# Patient Record
Sex: Female | Born: 1955 | Race: White | Hispanic: No | Smoking: Never smoker
Health system: Southern US, Community
[De-identification: ages and names within clinical notes are randomized; demographics above are authoritative.]

## PROBLEM LIST (undated history)

## (undated) DIAGNOSIS — J329 Chronic sinusitis, unspecified: Secondary | ICD-10-CM

## (undated) DIAGNOSIS — I10 Essential (primary) hypertension: Secondary | ICD-10-CM

## (undated) DIAGNOSIS — E669 Obesity, unspecified: Secondary | ICD-10-CM

## (undated) DIAGNOSIS — Z87442 Personal history of urinary calculi: Secondary | ICD-10-CM

## (undated) DIAGNOSIS — H209 Unspecified iridocyclitis: Secondary | ICD-10-CM

## (undated) DIAGNOSIS — K219 Gastro-esophageal reflux disease without esophagitis: Secondary | ICD-10-CM

## (undated) DIAGNOSIS — L570 Actinic keratosis: Secondary | ICD-10-CM

## (undated) HISTORY — PX: TONSILLECTOMY: SUR1361

## (undated) HISTORY — PX: CHOLECYSTECTOMY: SHX55

## (undated) HISTORY — PX: ERCP: SHX60

## (undated) HISTORY — DX: Actinic keratosis: L57.0

---

## 2004-09-19 ENCOUNTER — Ambulatory Visit: Payer: Self-pay | Admitting: Internal Medicine

## 2005-10-30 ENCOUNTER — Ambulatory Visit: Payer: Self-pay | Admitting: Internal Medicine

## 2007-05-27 ENCOUNTER — Ambulatory Visit: Payer: Self-pay | Admitting: Internal Medicine

## 2007-11-13 ENCOUNTER — Ambulatory Visit: Payer: Self-pay | Admitting: Unknown Physician Specialty

## 2008-05-12 ENCOUNTER — Ambulatory Visit: Payer: Self-pay | Admitting: Internal Medicine

## 2008-05-27 ENCOUNTER — Ambulatory Visit: Payer: Self-pay | Admitting: Internal Medicine

## 2009-08-08 ENCOUNTER — Ambulatory Visit: Payer: Self-pay | Admitting: Internal Medicine

## 2010-08-10 ENCOUNTER — Ambulatory Visit: Payer: Self-pay | Admitting: Internal Medicine

## 2010-08-13 ENCOUNTER — Ambulatory Visit: Payer: Self-pay | Admitting: Internal Medicine

## 2011-10-31 ENCOUNTER — Ambulatory Visit: Payer: Self-pay | Admitting: Internal Medicine

## 2013-01-22 ENCOUNTER — Ambulatory Visit: Payer: Self-pay | Admitting: Internal Medicine

## 2014-02-25 ENCOUNTER — Ambulatory Visit: Payer: Self-pay | Admitting: Internal Medicine

## 2015-01-31 ENCOUNTER — Other Ambulatory Visit: Payer: Self-pay | Admitting: Internal Medicine

## 2015-01-31 DIAGNOSIS — Z1231 Encounter for screening mammogram for malignant neoplasm of breast: Secondary | ICD-10-CM

## 2015-02-27 ENCOUNTER — Ambulatory Visit: Payer: Self-pay

## 2015-03-07 ENCOUNTER — Ambulatory Visit
Admission: RE | Admit: 2015-03-07 | Discharge: 2015-03-07 | Disposition: A | Discharge status: Home / Self Care | Payer: 59 | Source: Ambulatory Visit | Attending: Internal Medicine | Admitting: Internal Medicine

## 2015-03-07 DIAGNOSIS — Z1231 Encounter for screening mammogram for malignant neoplasm of breast: Secondary | ICD-10-CM | POA: Diagnosis present

## 2015-12-01 ENCOUNTER — Other Ambulatory Visit: Payer: Self-pay | Admitting: Internal Medicine

## 2015-12-01 DIAGNOSIS — Z1231 Encounter for screening mammogram for malignant neoplasm of breast: Secondary | ICD-10-CM

## 2016-03-07 ENCOUNTER — Encounter: Payer: Self-pay | Admitting: Radiology

## 2016-03-07 ENCOUNTER — Ambulatory Visit
Admission: RE | Admit: 2016-03-07 | Discharge: 2016-03-07 | Disposition: A | Discharge status: Home / Self Care | Payer: 59 | Source: Ambulatory Visit | Attending: Internal Medicine | Admitting: Internal Medicine

## 2016-03-07 DIAGNOSIS — Z1231 Encounter for screening mammogram for malignant neoplasm of breast: Secondary | ICD-10-CM | POA: Diagnosis not present

## 2016-12-27 ENCOUNTER — Other Ambulatory Visit: Payer: Self-pay | Admitting: Internal Medicine

## 2016-12-27 DIAGNOSIS — Z1231 Encounter for screening mammogram for malignant neoplasm of breast: Secondary | ICD-10-CM

## 2017-03-10 ENCOUNTER — Ambulatory Visit
Admission: RE | Admit: 2017-03-10 | Discharge: 2017-03-10 | Disposition: A | Discharge status: Home / Self Care | Payer: 59 | Source: Ambulatory Visit | Attending: Internal Medicine | Admitting: Internal Medicine

## 2017-03-10 DIAGNOSIS — Z1231 Encounter for screening mammogram for malignant neoplasm of breast: Secondary | ICD-10-CM | POA: Diagnosis present

## 2018-01-01 ENCOUNTER — Encounter: Payer: Self-pay | Admitting: Anesthesiology

## 2018-01-02 ENCOUNTER — Ambulatory Visit: Payer: BLUE CROSS/BLUE SHIELD | Admitting: Anesthesiology

## 2018-01-02 ENCOUNTER — Ambulatory Visit
Admission: RE | Admit: 2018-01-02 | Discharge: 2018-01-02 | Disposition: A | Discharge status: Home / Self Care | Payer: BLUE CROSS/BLUE SHIELD | Source: Ambulatory Visit | Attending: Unknown Physician Specialty | Admitting: Unknown Physician Specialty

## 2018-01-02 ENCOUNTER — Encounter
Admission: RE | Disposition: A | Discharge status: Home / Self Care | Payer: Self-pay | Source: Ambulatory Visit | Attending: Unknown Physician Specialty

## 2018-01-02 ENCOUNTER — Encounter: Payer: Self-pay | Admitting: *Deleted

## 2018-01-02 DIAGNOSIS — Z7951 Long term (current) use of inhaled steroids: Secondary | ICD-10-CM | POA: Insufficient documentation

## 2018-01-02 DIAGNOSIS — E669 Obesity, unspecified: Secondary | ICD-10-CM | POA: Insufficient documentation

## 2018-01-02 DIAGNOSIS — I10 Essential (primary) hypertension: Secondary | ICD-10-CM | POA: Insufficient documentation

## 2018-01-02 DIAGNOSIS — K640 First degree hemorrhoids: Secondary | ICD-10-CM | POA: Diagnosis not present

## 2018-01-02 DIAGNOSIS — Z6837 Body mass index (BMI) 37.0-37.9, adult: Secondary | ICD-10-CM | POA: Diagnosis not present

## 2018-01-02 DIAGNOSIS — Z7982 Long term (current) use of aspirin: Secondary | ICD-10-CM | POA: Diagnosis not present

## 2018-01-02 DIAGNOSIS — Z1211 Encounter for screening for malignant neoplasm of colon: Secondary | ICD-10-CM | POA: Insufficient documentation

## 2018-01-02 DIAGNOSIS — Z79899 Other long term (current) drug therapy: Secondary | ICD-10-CM | POA: Insufficient documentation

## 2018-01-02 HISTORY — PX: COLONOSCOPY WITH PROPOFOL: SHX5780

## 2018-01-02 HISTORY — DX: Unspecified iridocyclitis: H20.9

## 2018-01-02 HISTORY — DX: Chronic sinusitis, unspecified: J32.9

## 2018-01-02 HISTORY — DX: Gastro-esophageal reflux disease without esophagitis: K21.9

## 2018-01-02 HISTORY — DX: Obesity, unspecified: E66.9

## 2018-01-02 HISTORY — DX: Personal history of urinary calculi: Z87.442

## 2018-01-02 HISTORY — DX: Essential (primary) hypertension: I10

## 2018-01-02 SURGERY — COLONOSCOPY WITH PROPOFOL
Anesthesia: General

## 2018-01-02 MED ORDER — LIDOCAINE HCL (PF) 2 % IJ SOLN
INTRAMUSCULAR | Status: AC
  Filled 2018-01-02: qty 10

## 2018-01-02 MED ORDER — LIDOCAINE 2% (20 MG/ML) 5 ML SYRINGE
INTRAMUSCULAR | Status: DC | PRN
  Administered 2018-01-02: 30 mg via INTRAVENOUS

## 2018-01-02 MED ORDER — PROPOFOL 500 MG/50ML IV EMUL
INTRAVENOUS | Status: DC | PRN
  Administered 2018-01-02: 180 ug/kg/min via INTRAVENOUS

## 2018-01-02 MED ORDER — FENTANYL CITRATE (PF) 100 MCG/2ML IJ SOLN
INTRAMUSCULAR | Status: DC | PRN
  Administered 2018-01-02 (×2): 50 ug via INTRAVENOUS

## 2018-01-02 MED ORDER — GLYCOPYRROLATE 0.2 MG/ML IJ SOLN
INTRAMUSCULAR | Status: AC
  Filled 2018-01-02: qty 1

## 2018-01-02 MED ORDER — PROPOFOL 10 MG/ML IV BOLUS
INTRAVENOUS | Status: DC | PRN
  Administered 2018-01-02: 100 mg via INTRAVENOUS

## 2018-01-02 MED ORDER — SODIUM CHLORIDE 0.9 % IV SOLN
INTRAVENOUS | Status: DC
  Administered 2018-01-02: 1000 mL via INTRAVENOUS

## 2018-01-02 MED ORDER — PROPOFOL 500 MG/50ML IV EMUL
INTRAVENOUS | Status: AC
  Filled 2018-01-02: qty 50

## 2018-01-02 MED ORDER — EPHEDRINE SULFATE 50 MG/ML IJ SOLN
INTRAMUSCULAR | Status: AC
  Filled 2018-01-02: qty 1

## 2018-01-02 MED ORDER — FENTANYL CITRATE (PF) 100 MCG/2ML IJ SOLN
INTRAMUSCULAR | Status: AC
  Filled 2018-01-02: qty 2

## 2018-01-02 MED ORDER — SODIUM CHLORIDE 0.9 % IV SOLN: INTRAVENOUS | Status: DC

## 2018-01-02 MED ORDER — PHENYLEPHRINE HCL 10 MG/ML IJ SOLN
INTRAMUSCULAR | Status: AC
  Filled 2018-01-02: qty 1

## 2018-01-02 MED ORDER — PROPOFOL 10 MG/ML IV BOLUS
INTRAVENOUS | Status: AC
  Filled 2018-01-02: qty 20

## 2018-01-02 NOTE — Transfer of Care (Signed)
Immediate Anesthesia Transfer of Care Note  Patient: Teresa Huerta  Procedure(s) Performed: COLONOSCOPY WITH PROPOFOL (N/A )  Patient Location: PACU and Endoscopy Unit  Anesthesia Type:General  Level of Consciousness: drowsy  Airway & Oxygen Therapy: Patient Spontanous Breathing and Patient connected to nasal cannula oxygen  Post-op Assessment: Post -op Vital signs reviewed and stable  Post vital signs: Reviewed and stable  Last Vitals:  Vitals Value Taken Time  BP    Temp    Pulse    Resp    SpO2      Last Pain:  Vitals:   01/02/18 0752  TempSrc: (P) Tympanic  PainSc:       Patients Stated Pain Goal: 0 (09/19/17 7588)  Complications: No apparent anesthesia complications

## 2018-01-02 NOTE — H&P (Signed)
Primary Care Physician:  Idelle Crouch, MD Primary Gastroenterologist:  Dr. Vira Agar  Pre-Procedure History & Physical: HPI:  Teresa Huerta is a 62 y.o. female is here for an colonoscopy.  Last one was 11/13/2007   Past Medical History:  Diagnosis Date  . GERD (gastroesophageal reflux disease)   . History of kidney stones   . Hypertension   . Obesity   . Recurrent sinusitis   . Uveitis     Past Surgical History:  Procedure Laterality Date  . CHOLECYSTECTOMY    . ERCP    . TONSILLECTOMY      Prior to Admission medications   Medication Sig Start Date End Date Taking? Authorizing Provider  aspirin EC 81 MG tablet Take 81 mg by mouth daily.   Yes [provider]  Black Cohosh 20 MG TABS Take by mouth.   Yes [provider]  calcium carbonate (TUMS - DOSED IN MG ELEMENTAL CALCIUM) 500 MG chewable tablet Chew 1 tablet by mouth daily.   Yes [provider]  cetirizine (ZYRTEC) 10 MG tablet Take 10 mg by mouth daily.   Yes [provider]  Cholecalciferol (VITAMIN D3) 1000 units CAPS Take 2,000 Units by mouth daily.   Yes [provider]  fluticasone (FLONASE) 50 MCG/ACT nasal spray Place into both nostrils daily.   Yes [provider]  hydrochlorothiazide (HYDRODIURIL) 25 MG tablet Take 25 mg by mouth daily.   Yes [provider]  pravastatin (PRAVACHOL) 40 MG tablet Take 40 mg by mouth daily.   Yes [provider]    Allergies as of 01/01/2018 - Review Complete 01/01/2018  Allergen Reaction Noted  . Relafen [nabumetone]  01/01/2018  . Zithromax [azithromycin]  01/01/2018    Family History  Problem Relation Age of Onset  . Breast cancer Neg Hx     Social History   Socioeconomic History  . Marital status: Significant Other    Spouse name: Not on file  . Number of children: Not on file  . Years of education: Not on file  . Highest education level: Not on file  Occupational History  . Not on  file  Social Needs  . Financial resource strain: Not on file  . Food insecurity:    Worry: Not on file    Inability: Not on file  . Transportation needs:    Medical: Not on file    Non-medical: Not on file  Tobacco Use  . Smoking status: Never Smoker  . Smokeless tobacco: Never Used  Substance and Sexual Activity  . Alcohol use: Yes    Comment: occ  . Drug use: Not Currently  . Sexual activity: Not on file  Lifestyle  . Physical activity:    Days per week: Not on file    Minutes per session: Not on file  . Stress: Not on file  Relationships  . Social connections:    Talks on phone: Not on file    Gets together: Not on file    Attends religious service: Not on file    Active member of club or organization: Not on file    Attends meetings of clubs or organizations: Not on file    Relationship status: Not on file  . Intimate partner violence:    Fear of current or ex partner: Not on file    Emotionally abused: Not on file    Physically abused: Not on file    Forced sexual activity: Not on file  Other  Topics Concern  . Not on file  Social History Narrative  . Not on file    Review of Systems: See HPI, otherwise negative ROS  Physical Exam: BP (!) 183/95   Pulse 69   Temp (!) 97.1 F (36.2 C) (Tympanic)   Resp 18   Ht 5\' 4"  (1.626 m)   Wt 99.8 kg   SpO2 99%   BMI 37.76 kg/m  General:   Alert,  pleasant and cooperative in NAD Head:  Normocephalic and atraumatic. Neck:  Supple; no masses or thyromegaly. Lungs:  Clear throughout to auscultation.    Heart:  Regular rate and rhythm. Abdomen:  Soft, nontender and nondistended. Normal bowel sounds, without guarding, and without rebound.   Neurologic:  Alert and  oriented x4;  grossly normal neurologically.  Impression/Plan: Teresa Huerta is here for an colonoscopy to be performed for colon cancer screening.  Risks, benefits, limitations, and alternatives regarding  colonoscopy have been reviewed with the  patient.  Questions have been answered.  All parties agreeable.   Gaylyn Cheers, MD  01/02/2018, 7:25 AM

## 2018-01-02 NOTE — Op Note (Signed)
[facility] Gastroenterology Patient Name: Teresa Huerta Procedure Date: 01/02/2018 7:26 AM MRN: [id number] Account #: [id number] Date of Birth: [birth year] Admit Type: Outpatient Age: 62 Room: [facility] ENDO ROOM 1 Gender: Female Note Status: Finalized Procedure:            Colonoscopy Indications:          Screening for colorectal malignant neoplasm Providers:            Manya Silvas, MD Referring MD:         Leonie Douglas. Doy Hutching, MD (Referring MD) Medicines:            Propofol per Anesthesia Complications:        No immediate complications. Procedure:            Pre-Anesthesia Assessment:                       - After reviewing the risks and benefits, the patient                        was deemed in satisfactory condition to undergo the                        procedure.                       After obtaining informed consent, the colonoscope was                        passed under direct vision. Throughout the procedure,                        the patient's blood pressure, pulse, and oxygen                        saturations were monitored continuously. The                        Colonoscope was introduced through the anus and                        advanced to the the cecum, identified by appendiceal                        orifice and ileocecal valve. The colonoscopy was                        performed without difficulty. The patient tolerated the                        procedure well. The quality of the bowel preparation                        was excellent. Findings:      Internal hemorrhoids were found during endoscopy. The hemorrhoids were       small and Grade I (internal hemorrhoids that do not prolapse).      The exam was otherwise without abnormality. Impression:           - Internal hemorrhoids.                       - The examination was  otherwise normal.                       - No specimens collected. Recommendation:       - Repeat colonoscopy in  10 years for screening purposes. Manya Silvas, MD 01/02/2018 7:50:46 AM This report has been signed electronically. Number of Addenda: 0 Note Initiated On: 01/02/2018 7:26 AM Scope Withdrawal Time: 0 hours 7 minutes 47 seconds  Total Procedure Duration: 0 hours 13 minutes 22 seconds       [facility]

## 2018-01-02 NOTE — Anesthesia Postprocedure Evaluation (Signed)
Anesthesia Post Note  Patient: Teresa Huerta  Procedure(s) Performed: COLONOSCOPY WITH PROPOFOL (N/A )  Patient location during evaluation: Endoscopy Anesthesia Type: General Level of consciousness: awake and alert Pain management: pain level controlled Vital Signs Assessment: post-procedure vital signs reviewed and stable Respiratory status: spontaneous breathing, nonlabored ventilation, respiratory function stable and patient connected to nasal cannula oxygen Cardiovascular status: blood pressure returned to baseline and stable Postop Assessment: no apparent nausea or vomiting Anesthetic complications: no     Last Vitals:  Vitals:   01/02/18 0812 01/02/18 0822  BP: 125/88 136/75  Pulse: 62 64  Resp: 14 12  Temp:    SpO2: 100% 100%    Last Pain:  Vitals:   01/02/18 0822  TempSrc:   PainSc: 0-No pain                 Xavien Dauphinais S

## 2018-01-02 NOTE — Anesthesia Post-op Follow-up Note (Signed)
Anesthesia QCDR form completed.        

## 2018-01-02 NOTE — Anesthesia Preprocedure Evaluation (Signed)
Anesthesia Evaluation  Patient identified by MRN, date of birth, ID band Patient awake    Reviewed: Allergy & Precautions, NPO status , Patient's Chart, lab work & pertinent test results, reviewed documented beta blocker date and time   Airway Mallampati: II  TM Distance: >3 FB     Dental  (+) Chipped   Pulmonary           Cardiovascular hypertension, Pt. on medications      Neuro/Psych    GI/Hepatic GERD  ,  Endo/Other    Renal/GU Renal disease     Musculoskeletal   Abdominal   Peds  Hematology   Anesthesia Other Findings   Reproductive/Obstetrics                             Anesthesia Physical Anesthesia Plan  ASA: III  Anesthesia Plan: General   Post-op Pain Management:    Induction: Intravenous  PONV Risk Score and Plan:   Airway Management Planned:   Additional Equipment:   Intra-op Plan:   Post-operative Plan:   Informed Consent: I have reviewed the patients History and Physical, chart, labs and discussed the procedure including the risks, benefits and alternatives for the proposed anesthesia with the patient or authorized representative who has indicated his/her understanding and acceptance.     Plan Discussed with: CRNA  Anesthesia Plan Comments:         Anesthesia Quick Evaluation

## 2018-01-06 ENCOUNTER — Encounter: Payer: Self-pay | Admitting: Unknown Physician Specialty

## 2018-01-09 ENCOUNTER — Other Ambulatory Visit: Payer: Self-pay | Admitting: Internal Medicine

## 2018-01-09 DIAGNOSIS — Z1231 Encounter for screening mammogram for malignant neoplasm of breast: Secondary | ICD-10-CM

## 2018-03-18 ENCOUNTER — Ambulatory Visit
Admission: RE | Admit: 2018-03-18 | Discharge: 2018-03-18 | Disposition: A | Discharge status: Home / Self Care | Payer: BLUE CROSS/BLUE SHIELD | Source: Ambulatory Visit | Attending: Internal Medicine | Admitting: Internal Medicine

## 2018-03-18 DIAGNOSIS — Z1231 Encounter for screening mammogram for malignant neoplasm of breast: Secondary | ICD-10-CM | POA: Diagnosis not present

## 2018-11-18 ENCOUNTER — Other Ambulatory Visit: Payer: Self-pay | Admitting: Internal Medicine

## 2018-11-18 DIAGNOSIS — Z1231 Encounter for screening mammogram for malignant neoplasm of breast: Secondary | ICD-10-CM

## 2019-03-22 ENCOUNTER — Ambulatory Visit
Admission: RE | Admit: 2019-03-22 | Discharge: 2019-03-22 | Disposition: A | Discharge status: Home / Self Care | Payer: BC Managed Care – PPO | Source: Ambulatory Visit | Attending: Internal Medicine | Admitting: Internal Medicine

## 2019-03-22 DIAGNOSIS — Z1231 Encounter for screening mammogram for malignant neoplasm of breast: Secondary | ICD-10-CM | POA: Insufficient documentation

## 2020-01-04 ENCOUNTER — Other Ambulatory Visit: Payer: Self-pay | Admitting: Internal Medicine

## 2020-01-04 DIAGNOSIS — Z1231 Encounter for screening mammogram for malignant neoplasm of breast: Secondary | ICD-10-CM

## 2020-03-22 ENCOUNTER — Other Ambulatory Visit: Payer: Self-pay

## 2020-03-22 ENCOUNTER — Ambulatory Visit
Admission: RE | Admit: 2020-03-22 | Discharge: 2020-03-22 | Disposition: A | Discharge status: Home / Self Care | Payer: BC Managed Care – PPO | Source: Ambulatory Visit | Attending: Internal Medicine | Admitting: Internal Medicine

## 2020-03-22 DIAGNOSIS — Z1231 Encounter for screening mammogram for malignant neoplasm of breast: Secondary | ICD-10-CM | POA: Diagnosis present

## 2020-05-02 ENCOUNTER — Ambulatory Visit: Payer: BC Managed Care – PPO | Admitting: Dermatology

## 2020-05-02 ENCOUNTER — Other Ambulatory Visit: Payer: Self-pay

## 2020-05-02 DIAGNOSIS — L578 Other skin changes due to chronic exposure to nonionizing radiation: Secondary | ICD-10-CM

## 2020-05-02 DIAGNOSIS — D225 Melanocytic nevi of trunk: Secondary | ICD-10-CM | POA: Diagnosis not present

## 2020-05-02 DIAGNOSIS — L409 Psoriasis, unspecified: Secondary | ICD-10-CM | POA: Diagnosis not present

## 2020-05-02 DIAGNOSIS — Z1283 Encounter for screening for malignant neoplasm of skin: Secondary | ICD-10-CM | POA: Diagnosis not present

## 2020-05-02 DIAGNOSIS — L820 Inflamed seborrheic keratosis: Secondary | ICD-10-CM

## 2020-05-02 DIAGNOSIS — L905 Scar conditions and fibrosis of skin: Secondary | ICD-10-CM

## 2020-05-02 DIAGNOSIS — L814 Other melanin hyperpigmentation: Secondary | ICD-10-CM

## 2020-05-02 DIAGNOSIS — D229 Melanocytic nevi, unspecified: Secondary | ICD-10-CM

## 2020-05-02 DIAGNOSIS — D1800 Hemangioma unspecified site: Secondary | ICD-10-CM

## 2020-05-02 DIAGNOSIS — L821 Other seborrheic keratosis: Secondary | ICD-10-CM

## 2020-05-02 MED ORDER — CLOBETASOL PROPIONATE 0.05 % EX CREA: TOPICAL_CREAM | CUTANEOUS | 2 refills | Status: DC

## 2020-05-02 NOTE — Progress Notes (Signed)
Follow-Up Visit   Subjective  Teresa Huerta is a 65 y.o. female who presents for the following: Annual Exam.  Patient here for TBSE. She has a lesion on her left lower cheek that is flaky and growing, present for months. She also has a spot on her right medial lower leg that came up "puffy" a few months ago. It has since gone down, but left a dark spot on her leg. No bleeding.  The following portions of the chart were reviewed this encounter and updated as appropriate:       Review of Systems:  No other skin or systemic complaints except as noted in HPI or Assessment and Plan.  Objective  Well appearing patient in no apparent distress; mood and affect are within normal limits.  A full examination was performed including scalp, head, eyes, ears, nose, lips, neck, chest, axillae, abdomen, back, buttocks, bilateral upper extremities, bilateral lower extremities, hands, feet, fingers, toes, fingernails, and toenails. All findings within normal limits unless otherwise noted below.  Objective  elbows: Small pink scaly patch on left elbow.  Objective  Right Lower Back: 4.0 x 2.63mm medium dark brown macule  Objective  R med ankle x 1, R med lower knee x 1, L lower cheek x 1 (3): Erythematous keratotic or waxy stuck-on papule  Objective  Right Shoulder, R lat knee: Flat white scar   Assessment & Plan   Skin cancer screening performed today.  Actinic Damage - chronic, secondary to cumulative UV radiation exposure/sun exposure over time - diffuse scaly erythematous macules with underlying dyspigmentation - Recommend daily broad spectrum sunscreen SPF 30+ to sun-exposed areas, reapply every 2 hours as needed.  - Call for new or changing lesions.  Lentigines - Scattered tan macules - Discussed due to sun exposure - Benign, observe - Recommend daily broad spectrum sunscreen SPF 30+ to sun-exposed areas, reapply every 2 hours as needed. - Call for any changes  Seborrheic  Keratoses - Stuck-on, waxy, tan-brown papules and plaques  - Discussed benign etiology and prognosis. - Observe - Call for any changes  Hemangiomas - Red papules - Discussed benign nature - Observe - Call for any changes  Melanocytic Nevi - Tan-brown and/or pink-flesh-colored symmetric macules and papules - Benign appearing on exam today - Observation - Call clinic for new or changing moles - Recommend daily use of broad spectrum spf 30+ sunscreen to sun-exposed areas.    Psoriasis elbows  Chronic condition. Controlled Continue clobetasol cream qd/bid prn flares. Avoid face, groin, axilla.  Topical steroids (such as triamcinolone, fluocinolone, fluocinonide, mometasone, clobetasol, halobetasol, betamethasone, hydrocortisone) can cause thinning and lightening of the skin if they are used for too long in the same area. Your physician has selected the right strength medicine for your problem and area affected on the body. Please use your medication only as directed by your physician to prevent side effects.    clobetasol cream (TEMOVATE) 0.05 % - elbows  Nevus Right Lower Back  Benign-appearing.  Stable. Observation.  Call clinic for new or changing moles.  Recommend daily use of broad spectrum spf 30+ sunscreen to sun-exposed areas.    Inflamed seborrheic keratosis (3) R med ankle x 1, R med lower knee x 1, L lower cheek x 1  Destruction of lesion - R med ankle x 1, R med lower knee x 1, L lower cheek x 1  Destruction method: cryotherapy   Informed consent: discussed and consent obtained   Lesion destroyed using liquid nitrogen:  Yes   Region frozen until ice ball extended beyond lesion: Yes   Outcome: patient tolerated procedure well with no complications   Post-procedure details: wound care instructions given    Scar Right Shoulder, R lat knee  Clear. Observe for recurrence. Call clinic for new or changing lesions.  Recommend regular skin exams, daily broad-spectrum  spf 30+ sunscreen use, and photoprotection.     Return in about 2 months (around 06/30/2020) for f/u ISKs. Also schedule 1 year TBSE.Lindi Adie, CMA, am acting as scribe for Brendolyn Patty, MD .  Documentation: I have reviewed the above documentation for accuracy and completeness, and I agree with the above.  Brendolyn Patty MD

## 2020-05-02 NOTE — Patient Instructions (Addendum)
Cryotherapy Aftercare  . Wash gently with soap and water everyday.   Marland Kitchen Apply Vaseline and Band-Aid daily until healed.   Clobetasol cream - Apply to psoriasis on elbows 1-2 times daily as needed. Avoid face, groin, underarms. Prescription put on hold at [facility].

## 2020-07-10 ENCOUNTER — Ambulatory Visit: Payer: BC Managed Care – PPO | Admitting: Dermatology

## 2021-01-22 ENCOUNTER — Other Ambulatory Visit: Payer: Self-pay | Admitting: Internal Medicine

## 2021-01-22 DIAGNOSIS — Z1231 Encounter for screening mammogram for malignant neoplasm of breast: Secondary | ICD-10-CM

## 2021-04-03 ENCOUNTER — Other Ambulatory Visit: Payer: Self-pay

## 2021-04-03 ENCOUNTER — Ambulatory Visit
Admission: RE | Admit: 2021-04-03 | Discharge: 2021-04-03 | Disposition: A | Discharge status: Home / Self Care | Payer: BC Managed Care – PPO | Source: Ambulatory Visit | Attending: Internal Medicine | Admitting: Internal Medicine

## 2021-04-03 DIAGNOSIS — Z1231 Encounter for screening mammogram for malignant neoplasm of breast: Secondary | ICD-10-CM | POA: Insufficient documentation

## 2021-05-08 ENCOUNTER — Other Ambulatory Visit: Payer: Self-pay

## 2021-05-08 ENCOUNTER — Ambulatory Visit: Payer: BC Managed Care – PPO | Admitting: Dermatology

## 2021-05-08 DIAGNOSIS — D229 Melanocytic nevi, unspecified: Secondary | ICD-10-CM

## 2021-05-08 DIAGNOSIS — L820 Inflamed seborrheic keratosis: Secondary | ICD-10-CM | POA: Diagnosis not present

## 2021-05-08 DIAGNOSIS — L409 Psoriasis, unspecified: Secondary | ICD-10-CM

## 2021-05-08 DIAGNOSIS — Z1283 Encounter for screening for malignant neoplasm of skin: Secondary | ICD-10-CM | POA: Diagnosis not present

## 2021-05-08 DIAGNOSIS — L578 Other skin changes due to chronic exposure to nonionizing radiation: Secondary | ICD-10-CM

## 2021-05-08 DIAGNOSIS — D225 Melanocytic nevi of trunk: Secondary | ICD-10-CM

## 2021-05-08 DIAGNOSIS — L821 Other seborrheic keratosis: Secondary | ICD-10-CM

## 2021-05-08 DIAGNOSIS — L570 Actinic keratosis: Secondary | ICD-10-CM | POA: Diagnosis not present

## 2021-05-08 DIAGNOSIS — D1800 Hemangioma unspecified site: Secondary | ICD-10-CM

## 2021-05-08 DIAGNOSIS — L814 Other melanin hyperpigmentation: Secondary | ICD-10-CM

## 2021-05-08 NOTE — Patient Instructions (Addendum)
Actinic keratoses are precancerous spots that appear secondary to cumulative UV radiation exposure/sun exposure over time. They are chronic with expected duration over 1 year. A portion of actinic keratoses will progress to squamous cell carcinoma of the skin. It is not possible to reliably predict which spots will progress to skin cancer and so treatment is recommended to prevent development of skin cancer.  Recommend daily broad spectrum sunscreen SPF 30+ to sun-exposed areas, reapply every 2 hours as needed.  Recommend staying in the shade or wearing long sleeves, sun glasses (UVA+UVB protection) and wide brim hats (4-inch brim around the entire circumference of the hat). Call for new or changing lesions.   Cryotherapy Aftercare  Wash gently with soap and water everyday.   Apply Vaseline and Band-Aid daily until healed.    Seborrheic Keratosis  What causes seborrheic keratoses? Seborrheic keratoses are harmless, common skin growths that first appear during adult life.  As time goes by, more growths appear.  Some people may develop a large number of them.  Seborrheic keratoses appear on both covered and uncovered body parts.  They are not caused by sunlight.  The tendency to develop seborrheic keratoses can be inherited.  They vary in color from skin-colored to gray, brown, or even black.  They can be either smooth or have a rough, warty surface.   Seborrheic keratoses are superficial and look as if they were stuck on the skin.  Under the microscope this type of keratosis looks like layers upon layers of skin.  That is why at times the top layer may seem to fall off, but the rest of the growth remains and re-grows.    Treatment Seborrheic keratoses do not need to be treated, but can easily be removed in the office.  Seborrheic keratoses often cause symptoms when they rub on clothing or jewelry.  Lesions can be in the way of shaving.  If they become inflamed, they can cause itching, soreness, or  burning.  Removal of a seborrheic keratosis can be accomplished by freezing, burning, or surgery. If any spot bleeds, scabs, or grows rapidly, please return to have it checked, as these can be an indication of a skin cancer.    Melanoma ABCDEs  Melanoma is the most dangerous type of skin cancer, and is the leading cause of death from skin disease.  You are more likely to develop melanoma if you: Have light-colored skin, light-colored eyes, or red or blond hair Spend a lot of time in the sun Tan regularly, either outdoors or in a tanning bed Have had blistering sunburns, especially during childhood Have a close family member who has had a melanoma Have atypical moles or large birthmarks  Early detection of melanoma is key since treatment is typically straightforward and cure rates are extremely high if we catch it early.   The first sign of melanoma is often a change in a mole or a new dark spot.  The ABCDE system is a way of remembering the signs of melanoma.  A for asymmetry:  The two halves do not match. B for border:  The edges of the growth are irregular. C for color:  A mixture of colors are present instead of an even brown color. D for diameter:  Melanomas are usually (but not always) greater than 16mm - the size of a pencil eraser. E for evolution:  The spot keeps changing in size, shape, and color.  Please check your skin once per month between visits. You can use  a small mirror in front and a large mirror behind you to keep an eye on the back side or your body.   If you see any new or changing lesions before your next follow-up, please call to schedule a visit.  Please continue daily skin protection including broad spectrum sunscreen SPF 30+ to sun-exposed areas, reapplying every 2 hours as needed when you're outdoors.   Staying in the shade or wearing long sleeves, sun glasses (UVA+UVB protection) and wide brim hats (4-inch brim around the entire circumference of the hat) are  also recommended for sun protection.  ..  If You Need Anything After Your Visit  If you have any questions or concerns for your doctor, please call our main line at [phone] and press option 4 to reach your doctor's medical assistant. If no one answers, please leave a voicemail as directed and we will return your call as soon as possible. Messages left after 4 pm will be answered the following business day.   You may also send [place] a message via Center. We typically respond to MyChart messages within 1-2 business days.  For prescription refills, please ask your pharmacy to contact our office. Our fax number is [phone].  If you have an urgent issue when the clinic is closed that cannot wait until the next business day, you can page your doctor at the number below.    Please note that while we do our best to be available for urgent issues outside of office hours, we are not available 24/7.   If you have an urgent issue and are unable to reach [place], you may choose to seek medical care at your doctor's office, retail clinic, urgent care center, or emergency room.  If you have a medical emergency, please immediately call 911 or go to the emergency department.  Pager Numbers  - Dr. Nehemiah Massed: [phone]  - Dr. Laurence Ferrari: [phone]  - Dr. Nicole Kindred: [phone]  In the event of inclement weather, please call our main line at [phone] for an update on the status of any delays or closures.  Dermatology Medication Tips: Please keep the boxes that topical medications come in in order to help keep track of the instructions about where and how to use these. Pharmacies typically print the medication instructions only on the boxes and not directly on the medication tubes.   If your medication is too expensive, please contact our office at [phone] option 4 or send [place] a message through [place].   We are unable to tell what your co-pay for medications will be in advance as this is  different depending on your insurance coverage. However, we may be able to find a substitute medication at lower cost or fill out paperwork to get insurance to cover a needed medication.   If a prior authorization is required to get your medication covered by your insurance company, please allow [place] 1-2 business days to complete this process.  Drug prices often vary depending on where the prescription is filled and some pharmacies may offer cheaper prices.  The website www.goodrx.com contains coupons for medications through different pharmacies. The prices here do not account for what the cost may be with help from insurance (it may be cheaper with your insurance), but the website can give you the price if you did not use any insurance.  - You can print the associated coupon and take it with your prescription to the pharmacy.  - You may also stop by our office during regular business  hours and pick up a GoodRx coupon card.  - If you need your prescription sent electronically to a different pharmacy, notify our office through [facility] or by phone at [phone] option 4.     Si Usted Necesita Algo Despus de Su Visita  Tambin puede enviarnos un mensaje a travs de Pharmacist, community. Por lo general respondemos a los mensajes de MyChart en el transcurso de 1 a 2 das hbiles.  Para renovar recetas, por favor pida a su farmacia que se ponga en contacto con nuestra oficina. Harland Dingwall de fax es [place] [phone].  Si tiene un asunto urgente cuando la clnica est cerrada y que no puede esperar hasta el siguiente da hbil, puede llamar/localizar a su doctor(a) al nmero que aparece a continuacin.   Por favor, tenga en cuenta que aunque hacemos todo lo posible para estar disponibles para asuntos urgentes fuera del horario de [place], no estamos disponibles las 24 horas del da, los 7 das de la [place].   Si tiene un problema urgente y no puede comunicarse con nosotros, puede optar por buscar  atencin mdica  en el consultorio de su doctor(a), en una clnica privada, en un centro de atencin urgente o en una sala de emergencias.  Si tiene Engineering geologist, por favor llame inmediatamente al 911 o vaya a la sala de emergencias.  Nmeros de bper  - Dr. Nehemiah Massed: [phone]  - Dra. Moye: [phone]  - Dra. Nicole Kindred: [phone]  En caso de inclemencias del [place], por favor llame a Johnsie Kindred principal al [phone] para una actualizacin sobre el [place] de cualquier retraso o cierre.  Consejos para la medicacin en dermatologa: Por favor, guarde las cajas en las que vienen los medicamentos de uso tpico para ayudarle a seguir las instrucciones sobre dnde y cmo usarlos. Las farmacias generalmente imprimen las instrucciones del medicamento slo en las cajas y no directamente en los tubos del [place].   Si su medicamento es muy caro, por favor, pngase en contacto con Zigmund Daniel llamando al [phone] y presione la opcin 4 o envenos un mensaje a travs de Pharmacist, community.   No podemos decirle cul ser su copago por los medicamentos por adelantado ya que esto es diferente dependiendo de la cobertura de su seguro. Sin embargo, es posible que podamos encontrar un medicamento sustituto a Electrical engineer un formulario para que el seguro cubra el medicamento que se considera necesario.   Si se requiere una autorizacin previa para que su compaa de seguros [place] su medicamento, por favor permtanos de 1 a 2 das hbiles para completar este proceso.  Los precios de los medicamentos varan con frecuencia dependiendo del Environmental consultant de dnde se surte la receta y alguna farmacias pueden ofrecer precios ms baratos.  El sitio web www.goodrx.com tiene cupones para medicamentos de Airline pilot. Los precios aqu no tienen en cuenta lo que podra costar con la ayuda del seguro (puede ser ms barato con su seguro), pero el sitio web puede darle el precio si no utiliz  Research scientist (physical sciences).  - Puede imprimir el cupn correspondiente y llevarlo con su receta a la farmacia.  - Tambin puede pasar por nuestra oficina durante el horario de atencin regular y Charity fundraiser una tarjeta de cupones de GoodRx.  - Si necesita que su receta se enve electrnicamente a una farmacia diferente, informe a nuestra oficina a travs de MyChart de [place] o por telfono llamando al [phone] y presione la opcin 4.

## 2021-05-08 NOTE — Progress Notes (Signed)
Follow-Up Visit   Subjective  Teresa Huerta is a 66 y.o. female who presents for the following: Follow-up (Patient here today for 1 year tbse. She reports some spots at right and left hand she would like checked. She also reports a week ago, she developed a itchy spot at left lower leg. ).  The patient presents for Total-Body Skin Exam (TBSE) for skin cancer screening and mole check.  The patient has spots, moles and lesions to be evaluated, some may be new or changing and the patient has concerns that these could be cancer.   The following portions of the chart were reviewed this encounter and updated as appropriate:      Review of Systems: No other skin or systemic complaints except as noted in HPI or Assessment and Plan.   Objective  Well appearing patient in no apparent distress; mood and affect are within normal limits.  A full examination was performed including scalp, head, eyes, ears, nose, lips, neck, chest, axillae, abdomen, back, buttocks, bilateral upper extremities, bilateral lower extremities, hands, feet, fingers, toes, fingernails, and toenails. All findings within normal limits unless otherwise noted below.  Right Lower Back 5 x 2 mm medium dark brown macule   bilateral elbows Clear today   right hand dorsum 2nd mcp x 1, left dorsum hand base of 3rd finger x 1 (2) Erythematous thin papules/macules with gritty scale.   left lateral calf x 1 Erythematous stuck-on, waxy papule   Assessment & Plan  Nevus Right Lower Back  Benign-appearing.  Stable.  Observation.  Call clinic for new or changing lesions.  Recommend daily use of broad spectrum spf 30+ sunscreen to sun-exposed areas.    Psoriasis bilateral elbows  Psoriasis is a chronic non-curable, but treatable genetic/hereditary disease that may have other systemic features affecting other organ systems such as joints (Psoriatic Arthritis). It is associated with an increased risk of inflammatory bowel  disease, heart disease, non-alcoholic fatty liver disease, and depression.    Chronic condition with duration or expected duration over one year. Currently well-controlled.  Continue clobetasol cream 0.05 % qd/bid as needed for flares   Related Medications clobetasol cream (TEMOVATE) 0.05 % Apply to elbows 1-2 times daily as needed for psoriasis. Avoid face, groin, underarms.  Actinic keratosis (2) right hand dorsum 2nd mcp x 1, left dorsum hand base of 3rd finger x 1  Actinic keratoses are precancerous spots that appear secondary to cumulative UV radiation exposure/sun exposure over time. They are chronic with expected duration over 1 year. A portion of actinic keratoses will progress to squamous cell carcinoma of the skin. It is not possible to reliably predict which spots will progress to skin cancer and so treatment is recommended to prevent development of skin cancer.  Recommend daily broad spectrum sunscreen SPF 30+ to sun-exposed areas, reapply every 2 hours as needed.  Recommend staying in the shade or wearing long sleeves, sun glasses (UVA+UVB protection) and wide brim hats (4-inch brim around the entire circumference of the hat). Call for new or changing lesions.  Destruction of lesion - right hand dorsum 2nd mcp x 1, left dorsum hand base of 3rd finger x 1  Destruction method: cryotherapy   Informed consent: discussed and consent obtained   Lesion destroyed using liquid nitrogen: Yes   Region frozen until ice ball extended beyond lesion: Yes   Outcome: patient tolerated procedure well with no complications   Post-procedure details: wound care instructions given   Additional details:  Prior  to procedure, discussed risks of blister formation, small wound, skin dyspigmentation, or rare scar following cryotherapy. Recommend Vaseline ointment to treated areas while healing.   Inflamed seborrheic keratosis left lateral calf x 1  Irritated and bothers patient  Patient advised  to call if area has not healed in 4 - 6 weeks to call. If not resolved will consider biopsy    Destruction of lesion - left lateral calf x 1  Destruction method: cryotherapy   Informed consent: discussed and consent obtained   Lesion destroyed using liquid nitrogen: Yes   Region frozen until ice ball extended beyond lesion: Yes   Outcome: patient tolerated procedure well with no complications   Post-procedure details: wound care instructions given   Additional details:  Prior to procedure, discussed risks of blister formation, small wound, skin dyspigmentation, or rare scar following cryotherapy. Recommend Vaseline ointment to treated areas while healing.   Lentigines - Scattered tan macules - Due to sun exposure - Benign-appearing, observe - Recommend daily broad spectrum sunscreen SPF 30+ to sun-exposed areas, reapply every 2 hours as needed. - Call for any changes  Seborrheic Keratoses - Stuck-on, waxy, tan-brown papules and/or plaques  - Benign-appearing - Discussed benign etiology and prognosis. - Observe - Call for any changes  Melanocytic Nevi - Tan-brown and/or pink-flesh-colored symmetric macules and papules - Benign appearing on exam today - Observation - Call clinic for new or changing moles - Recommend daily use of broad spectrum spf 30+ sunscreen to sun-exposed areas.   Hemangiomas - Red papules - Discussed benign nature - Observe - Call for any changes  Actinic Damage - Chronic condition, secondary to cumulative UV/sun exposure - diffuse scaly erythematous macules with underlying dyspigmentation - Recommend daily broad spectrum sunscreen SPF 30+ to sun-exposed areas, reapply every 2 hours as needed.  - Staying in the shade or wearing long sleeves, sun glasses (UVA+UVB protection) and wide brim hats (4-inch brim around the entire circumference of the hat) are also recommended for sun protection.  - Call for new or changing lesions.  Skin cancer screening  performed today. Return for 1 year tbse . I, Ruthell Rummage, CMA, am acting as scribe for Brendolyn Patty, MD.  Documentation: I have reviewed the above documentation for accuracy and completeness, and I agree with the above.  Brendolyn Patty MD

## 2022-01-28 ENCOUNTER — Other Ambulatory Visit: Payer: Self-pay | Admitting: Internal Medicine

## 2022-01-28 DIAGNOSIS — Z1231 Encounter for screening mammogram for malignant neoplasm of breast: Secondary | ICD-10-CM

## 2022-04-04 ENCOUNTER — Ambulatory Visit
Admission: RE | Admit: 2022-04-04 | Discharge: 2022-04-04 | Disposition: A | Discharge status: Home / Self Care | Payer: Medicare Other | Source: Ambulatory Visit | Attending: Internal Medicine | Admitting: Internal Medicine

## 2022-04-04 DIAGNOSIS — Z1231 Encounter for screening mammogram for malignant neoplasm of breast: Secondary | ICD-10-CM | POA: Diagnosis not present

## 2022-05-13 ENCOUNTER — Ambulatory Visit (INDEPENDENT_AMBULATORY_CARE_PROVIDER_SITE_OTHER): Payer: Medicare Other | Admitting: Dermatology

## 2022-05-13 ENCOUNTER — Encounter: Payer: Self-pay | Admitting: Dermatology

## 2022-05-13 VITALS — BP 136/81 | HR 62

## 2022-05-13 DIAGNOSIS — Z1283 Encounter for screening for malignant neoplasm of skin: Secondary | ICD-10-CM | POA: Diagnosis not present

## 2022-05-13 DIAGNOSIS — D225 Melanocytic nevi of trunk: Secondary | ICD-10-CM

## 2022-05-13 DIAGNOSIS — D229 Melanocytic nevi, unspecified: Secondary | ICD-10-CM

## 2022-05-13 DIAGNOSIS — L905 Scar conditions and fibrosis of skin: Secondary | ICD-10-CM

## 2022-05-13 DIAGNOSIS — L821 Other seborrheic keratosis: Secondary | ICD-10-CM

## 2022-05-13 DIAGNOSIS — D2262 Melanocytic nevi of left upper limb, including shoulder: Secondary | ICD-10-CM

## 2022-05-13 DIAGNOSIS — L578 Other skin changes due to chronic exposure to nonionizing radiation: Secondary | ICD-10-CM

## 2022-05-13 DIAGNOSIS — L814 Other melanin hyperpigmentation: Secondary | ICD-10-CM

## 2022-05-13 DIAGNOSIS — L570 Actinic keratosis: Secondary | ICD-10-CM | POA: Diagnosis not present

## 2022-05-13 DIAGNOSIS — D2261 Melanocytic nevi of right upper limb, including shoulder: Secondary | ICD-10-CM

## 2022-05-13 DIAGNOSIS — L409 Psoriasis, unspecified: Secondary | ICD-10-CM | POA: Diagnosis not present

## 2022-05-13 NOTE — Patient Instructions (Addendum)
 Cryotherapy Aftercare  Wash gently with soap and water everyday.   Apply Vaseline and Band-Aid daily until healed. Due to recent changes in healthcare laws, you may see results of your pathology and/or laboratory studies on MyChart before the doctors have had a chance to review them. We understand that in some cases there may be results that are confusing or concerning to you. Please understand that not all results are received at the same time and often the doctors may need to interpret multiple results in order to provide you with the best plan of care or course of treatment. Therefore, we ask that you please give us 2 business days to thoroughly review all your results before contacting the office for clarification. Should we see a critical lab result, you will be contacted sooner.   If You Need Anything After Your Visit  If you have any questions or concerns for your doctor, please call our main line at [phone] and press option 4 to reach your doctor's medical assistant. If no one answers, please leave a voicemail as directed and we will return your call as soon as possible. Messages left after 4 pm will be answered the following business day.   You may also send us a message via MyChart. We typically respond to MyChart messages within 1-2 business days.  For prescription refills, please ask your pharmacy to contact our office. Our fax number is [phone].  If you have an urgent issue when the clinic is closed that cannot wait until the next business day, you can page your doctor at the number below.    Please note that while we do our best to be available for urgent issues outside of office hours, we are not available 24/7.   If you have an urgent issue and are unable to reach us, you may choose to seek medical care at your doctor's office, retail clinic, urgent care center, or emergency room.  If you have a medical emergency, please immediately call 911 or go to the emergency  department.  Pager Numbers  - Dr. Kowalski: [phone]  - Dr. Moye: [phone]  - Dr. Stewart: [phone]  In the event of inclement weather, please call our main line at [phone] for an update on the status of any delays or closures.  Dermatology Medication Tips: Please keep the boxes that topical medications come in in order to help keep track of the instructions about where and how to use these. Pharmacies typically print the medication instructions only on the boxes and not directly on the medication tubes.   If your medication is too expensive, please contact our office at [phone] option 4 or send us a message through MyChart.   We are unable to tell what your co-pay for medications will be in advance as this is different depending on your insurance coverage. However, we may be able to find a substitute medication at lower cost or fill out paperwork to get insurance to cover a needed medication.   If a prior authorization is required to get your medication covered by your insurance company, please allow us 1-2 business days to complete this process.  Drug prices often vary depending on where the prescription is filled and some pharmacies may offer cheaper prices.  The website www.goodrx.com contains coupons for medications through different pharmacies. The prices here do not account for what the cost may be with help from insurance (it may be cheaper with your insurance), but the website can give you the   price if you did not use any insurance.  - You can print the associated coupon and take it with your prescription to the pharmacy.  - You may also stop by our office during regular business hours and pick up a GoodRx coupon card.  - If you need your prescription sent electronically to a different pharmacy, notify our office through [facility] MyChart or by phone at [phone] option 4.     Si Usted Necesita Algo Despus de Su Visita  Tambin puede enviarnos un  mensaje a travs de MyChart. Por lo general respondemos a los mensajes de MyChart en el transcurso de 1 a 2 das hbiles.  Para renovar recetas, por favor pida a su farmacia que se ponga en contacto con nuestra oficina. Nuestro nmero de fax es el [phone].  Si tiene un asunto urgente cuando la clnica est cerrada y que no puede esperar hasta el siguiente da hbil, puede llamar/localizar a su doctor(a) al nmero que aparece a continuacin.   Por favor, tenga en cuenta que aunque hacemos todo lo posible para estar disponibles para asuntos urgentes fuera del horario de oficina, no estamos disponibles las 24 horas del da, los 7 das de la semana.   Si tiene un problema urgente y no puede comunicarse con nosotros, puede optar por buscar atencin mdica  en el consultorio de su doctor(a), en una clnica privada, en un centro de atencin urgente o en una sala de emergencias.  Si tiene una emergencia mdica, por favor llame inmediatamente al 911 o vaya a la sala de emergencias.  Nmeros de bper  - Dr. Kowalski: [phone]  - Dra. Moye: [phone]  - Dra. Stewart: [phone]  En caso de inclemencias del tiempo, por favor llame a nuestra lnea principal al [phone] para una actualizacin sobre el estado de cualquier retraso o cierre.  Consejos para la medicacin en dermatologa: Por favor, guarde las cajas en las que vienen los medicamentos de uso tpico para ayudarle a seguir las instrucciones sobre dnde y cmo usarlos. Las farmacias generalmente imprimen las instrucciones del medicamento slo en las cajas y no directamente en los tubos del medicamento.   Si su medicamento es muy caro, por favor, pngase en contacto con nuestra oficina llamando al [phone] y presione la opcin 4 o envenos un mensaje a travs de MyChart.   No podemos decirle cul ser su copago por los medicamentos por adelantado ya que esto es diferente dependiendo de la cobertura de su seguro. Sin embargo,  es posible que podamos encontrar un medicamento sustituto a menor costo o llenar un formulario para que el seguro cubra el medicamento que se considera necesario.   Si se requiere una autorizacin previa para que su compaa de seguros cubra su medicamento, por favor permtanos de 1 a 2 das hbiles para completar este proceso.  Los precios de los medicamentos varan con frecuencia dependiendo del lugar de dnde se surte la receta y alguna farmacias pueden ofrecer precios ms baratos.  El sitio web www.goodrx.com tiene cupones para medicamentos de diferentes farmacias. Los precios aqu no tienen en cuenta lo que podra costar con la ayuda del seguro (puede ser ms barato con su seguro), pero el sitio web puede darle el precio si no utiliz ningn seguro.  - Puede imprimir el cupn correspondiente y llevarlo con su receta a la farmacia.  - Tambin puede pasar por nuestra oficina durante el horario de atencin regular y recoger una tarjeta de cupones de GoodRx.  - Si necesita que   su receta se enve electrnicamente a una farmacia diferente, informe a nuestra oficina a travs de MyChart de [facility] o por telfono llamando al [phone] y presione la opcin 4.  

## 2022-05-13 NOTE — Progress Notes (Signed)
Follow-Up Visit   Subjective  Teresa Huerta is a 67 y.o. female who presents for the following: Total body skin exam (Hx of AKs) and scaly spot (L ear, ~43yr comes and goes).  The patient presents for Total-Body Skin Exam (TBSE) for skin cancer screening and mole check.  The patient has spots, moles and lesions to be evaluated, some may be new or changing and the patient has concerns that these could be cancer.   The following portions of the chart were reviewed this encounter and updated as appropriate:       Review of Systems:  No other skin or systemic complaints except as noted in HPI or Assessment and Plan.  Objective  Well appearing patient in no apparent distress; mood and affect are within normal limits.  A full examination was performed including scalp, head, eyes, ears, nose, lips, neck, chest, axillae, abdomen, back, buttocks, bilateral upper extremities, bilateral lower extremities, hands, feet, fingers, toes, fingernails, and toenails. All findings within normal limits unless otherwise noted below.  R upper arm; Right Lower Back  Right Lower Back 5.0 x 2.055mmed dark brown macule  R upper arm 2.53m83mark brown macule  L upper ear helix x 1 Pink scaly macule  bil elbows Pinkness with mild erythema and scale  R top of shoulder Pink white smooth patch       Assessment & Plan   Lentigines - Scattered tan macules - Due to sun exposure - Benign-appearing, observe - Recommend daily broad spectrum sunscreen SPF 30+ to sun-exposed areas, reapply every 2 hours as needed. - Call for any changes - face  Seborrheic Keratoses - Stuck-on, waxy, tan-brown papules and/or plaques  - Benign-appearing - Discussed benign etiology and prognosis. - Observe - Call for any changes - back, arms  Melanocytic Nevi - Tan-brown and/or pink-flesh-colored symmetric macules and papules - Benign appearing on exam today - Observation - Call clinic for new or changing  moles - Recommend daily use of broad spectrum spf 30+ sunscreen to sun-exposed areas.  - back  Actinic Damage - Chronic condition, secondary to cumulative UV/sun exposure - diffuse scaly erythematous macules with underlying dyspigmentation - Recommend daily broad spectrum sunscreen SPF 30+ to sun-exposed areas, reapply every 2 hours as needed.  - Staying in the shade or wearing long sleeves, sun glasses (UVA+UVB protection) and wide brim hats (4-inch brim around the entire circumference of the hat) are also recommended for sun protection.  - Call for new or changing lesions. - face  Skin cancer screening performed today.   Nevus (2) R upper arm; Right Lower Back  Benign-appearing. Stable compared to previous visit. Observation.  Call clinic for new or changing moles.  Recommend daily use of broad spectrum spf 30+ sunscreen to sun-exposed areas.    AK (actinic keratosis) L upper ear helix x 1  Destruction of lesion - L upper ear helix x 1  Destruction method: cryotherapy   Informed consent: discussed and consent obtained   Lesion destroyed using liquid nitrogen: Yes   Region frozen until ice ball extended beyond lesion: Yes   Outcome: patient tolerated procedure well with no complications   Post-procedure details: wound care instructions given   Additional details:  Prior to procedure, discussed risks of blister formation, small wound, skin dyspigmentation, or rare scar following cryotherapy. Recommend Vaseline ointment to treated areas while healing.   Psoriasis bil elbows  Chronic condition with duration or expected duration over one year. Currently well-controlled.   Counseling  on psoriasis and coordination of care  psoriasis is a chronic non-curable, but treatable genetic/hereditary disease that may have other systemic features affecting other organ systems such as joints (Psoriatic Arthritis). It is associated with an increased risk of inflammatory bowel disease, heart  disease, non-alcoholic fatty liver disease, and depression.  Treatments include light and laser treatments; topical medications; and systemic medications including oral and injectables.   No treatment needed at this time  Related Medications clobetasol cream (TEMOVATE) 0.05 % Apply to elbows 1-2 times daily as needed for psoriasis. Avoid face, groin, underarms.  Scar R top of shoulder  Benign-appearing.  Patient states benign growth excised in past 2013 by Dr. Tyler Deis.  Path not available since >59 years old.  Observation.   Return in about 1 year (around 05/13/2023) for TBSE, Hx of AKs.  I, Othelia Pulling, RMA, am acting as scribe for Brendolyn Patty, MD .  Documentation: I have reviewed the above documentation for accuracy and completeness, and I agree with the above.  Brendolyn Patty MD

## 2022-08-28 ENCOUNTER — Ambulatory Visit: Payer: Medicare Other | Admitting: Dermatology

## 2022-08-28 ENCOUNTER — Encounter: Payer: Self-pay | Admitting: Dermatology

## 2022-08-28 VITALS — BP 127/76 | HR 60

## 2022-08-28 DIAGNOSIS — L814 Other melanin hyperpigmentation: Secondary | ICD-10-CM | POA: Diagnosis not present

## 2022-08-28 DIAGNOSIS — L720 Epidermal cyst: Secondary | ICD-10-CM

## 2022-08-28 DIAGNOSIS — L570 Actinic keratosis: Secondary | ICD-10-CM | POA: Diagnosis not present

## 2022-08-28 DIAGNOSIS — L820 Inflamed seborrheic keratosis: Secondary | ICD-10-CM

## 2022-08-28 DIAGNOSIS — L821 Other seborrheic keratosis: Secondary | ICD-10-CM

## 2022-08-28 DIAGNOSIS — W908XXA Exposure to other nonionizing radiation, initial encounter: Secondary | ICD-10-CM | POA: Diagnosis not present

## 2022-08-28 NOTE — Patient Instructions (Addendum)
Cryotherapy Aftercare  Wash gently with soap and water everyday.   Apply Vaseline and Band-Aid daily until healed.    Milia (clogged pore) at right lower eyelid area: Recommend using Effaclar at bedtime, wash off in morning.     Recommend daily broad spectrum sunscreen SPF 30+ to sun-exposed areas, reapply every 2 hours as needed. Call for new or changing lesions.  Staying in the shade or wearing long sleeves, sun glasses (UVA+UVB protection) and wide brim hats (4-inch brim around the entire circumference of the hat) are also recommended for sun protection.    Due to recent changes in healthcare laws, you may see results of your pathology and/or laboratory studies on MyChart before the doctors have had a chance to review them. We understand that in some cases there may be results that are confusing or concerning to you. Please understand that not all results are received at the same time and often the doctors may need to interpret multiple results in order to provide you with the best plan of care or course of treatment. Therefore, we ask that you please give [place] 2 business days to thoroughly review all your results before contacting the office for clarification. Should we see a critical lab result, you will be contacted sooner.   If You Need Anything After Your Visit  If you have any questions or concerns for your doctor, please call our main line at [phone] and press option 4 to reach your doctor's medical assistant. If no one answers, please leave a voicemail as directed and we will return your call as soon as possible. Messages left after 4 pm will be answered the following business day.   You may also send [place] a message via MyChart. We typically respond to MyChart messages within 1-2 business days.  For prescription refills, please ask your pharmacy to contact our office. Our fax number is [phone].  If you have an urgent issue when the clinic is closed that cannot wait until  the next business day, you can page your doctor at the number below.    Please note that while we do our best to be available for urgent issues outside of office hours, we are not available 24/7.   If you have an urgent issue and are unable to reach [place], you may choose to seek medical care at your doctor's office, retail clinic, urgent care center, or emergency room.  If you have a medical emergency, please immediately call 911 or go to the emergency department.  Pager Numbers  - Dr. Gwen Pounds: [phone]  - Dr. Neale Burly: [phone]  - Dr. Roseanne Reno: [phone]  In the event of inclement weather, please call our main line at [phone] for an update on the status of any delays or closures.  Dermatology Medication Tips: Please keep the boxes that topical medications come in in order to help keep track of the instructions about where and how to use these. Pharmacies typically print the medication instructions only on the boxes and not directly on the medication tubes.   If your medication is too expensive, please contact our office at [phone] option 4 or send [place] a message through MyChart.   We are unable to tell what your co-pay for medications will be in advance as this is different depending on your insurance coverage. However, we may be able to find a substitute medication at lower cost or fill out paperwork to get insurance to cover a needed medication.   If a prior authorization is  required to get your medication covered by your insurance company, please allow [place] 1-2 business days to complete this process.  Drug prices often vary depending on where the prescription is filled and some pharmacies may offer cheaper prices.  The website www.goodrx.com contains coupons for medications through different pharmacies. The prices here do not account for what the cost may be with help from insurance (it may be cheaper with your insurance), but the website can give you the price if you did  not use any insurance.  - You can print the associated coupon and take it with your prescription to the pharmacy.  - You may also stop by our office during regular business hours and pick up a GoodRx coupon card.  - If you need your prescription sent electronically to a different pharmacy, notify our office through [facility] or by phone at [phone] option 4.     Si Usted Necesita Algo Despus de Su Visita  Tambin puede enviarnos un mensaje a travs de Pharmacist, community. Por lo general respondemos a los mensajes de MyChart en el transcurso de 1 a 2 das hbiles.  Para renovar recetas, por favor pida a su farmacia que se ponga en contacto con nuestra oficina. Harland Dingwall de fax es [place] [phone].  Si tiene un asunto urgente cuando la clnica est cerrada y que no puede esperar hasta el siguiente da hbil, puede llamar/localizar a su doctor(a) al nmero que aparece a continuacin.   Por favor, tenga en cuenta que aunque hacemos todo lo posible para estar disponibles para asuntos urgentes fuera del horario de [place], no estamos disponibles las 24 horas del da, los 7 das de la [place].   Si tiene un problema urgente y no puede comunicarse con nosotros, puede optar por buscar atencin mdica  en el consultorio de su doctor(a), en una clnica privada, en un centro de atencin urgente o en una sala de emergencias.  Si tiene Engineering geologist, por favor llame inmediatamente al 911 o vaya a la sala de emergencias.  Nmeros de bper  - Dr. Nehemiah Massed: [phone]  - Dra. Moye: [phone]  - Dra. Nicole Kindred: [phone]  En caso de inclemencias del [place], por favor llame a Johnsie Kindred principal al [phone] para una actualizacin sobre el Day de cualquier retraso o cierre.  Consejos para la medicacin en dermatologa: Por favor, guarde las cajas en las que vienen los medicamentos de uso tpico para ayudarle a seguir las instrucciones sobre dnde y cmo usarlos. Las  farmacias generalmente imprimen las instrucciones del medicamento slo en las cajas y no directamente en los tubos del [place].   Si su medicamento es muy caro, por favor, pngase en contacto con Zigmund Daniel llamando al [phone] y presione la opcin 4 o envenos un mensaje a travs de Pharmacist, community.   No podemos decirle cul ser su copago por los medicamentos por adelantado ya que esto es diferente dependiendo de la cobertura de su seguro. Sin embargo, es posible que podamos encontrar un medicamento sustituto a Electrical engineer un formulario para que el seguro cubra el medicamento que se considera necesario.   Si se requiere una autorizacin previa para que su compaa de seguros [place] su medicamento, por favor permtanos de 1 a 2 das hbiles para completar este proceso.  Los precios de los medicamentos varan con frecuencia dependiendo del Environmental consultant de dnde se surte la receta y alguna farmacias pueden ofrecer precios ms baratos.  El sitio web www.goodrx.com tiene cupones para medicamentos de  diferentes farmacias. Los precios aqu no tienen en cuenta lo que podra costar con la ayuda del seguro (puede ser ms barato con su seguro), pero el sitio web puede darle el precio si no utiliz ningn seguro.  - Puede imprimir el cupn correspondiente y llevarlo con su receta a la farmacia.  - Tambin puede pasar por nuestra oficina durante el horario de atencin regular y recoger una tarjeta de cupones de GoodRx.  - Si necesita que su receta se enve electrnicamente a una farmacia diferente, informe a nuestra oficina a travs de MyChart de [place] o por telfono llamando al [phone] y presione la opcin 4.  

## 2022-08-28 NOTE — Progress Notes (Signed)
Follow-Up Visit   Subjective  Teresa Huerta is a 67 y.o. female who presents for the following: Spot on back. Was pink, not as pink today. Itches at times. Non tender, denies stinging or burning. Right dorsal hand. Flaky spot. Will not go away. Spot on right nose, near eye.   The patient has spots, moles and lesions to be evaluated, some may be new or changing and the patient has concerns that these could be cancer.    The following portions of the chart were reviewed this encounter and updated as appropriate: medications, allergies, medical history  Review of Systems:  No other skin or systemic complaints except as noted in HPI or Assessment and Plan.  Objective  Well appearing patient in no apparent distress; mood and affect are within normal limits.  A focused examination was performed of the following areas: Face, back, right hand.   Relevant physical exam findings are noted in the Assessment and Plan.  Left Hand Dorsum at 2nd MCP x1 Erythematous keratotic papule with gritty scale.   Right Mid Back at bra line Erythematous keratotic or waxy stuck-on papule     Assessment & Plan   Hypertrophic actinic keratosis Left Hand Dorsum at 2nd MCP x1  Actinic keratoses are precancerous spots that appear secondary to cumulative UV radiation exposure/sun exposure over time. They are chronic with expected duration over 1 year. A portion of actinic keratoses will progress to squamous cell carcinoma of the skin. It is not possible to reliably predict which spots will progress to skin cancer and so treatment is recommended to prevent development of skin cancer.  Recommend daily broad spectrum sunscreen SPF 30+ to sun-exposed areas, reapply every 2 hours as needed.  Recommend staying in the shade or wearing long sleeves, sun glasses (UVA+UVB protection) and wide brim hats (4-inch brim around the entire circumference of the hat). Call for new or changing lesions.  Destruction of  lesion - Left Hand Dorsum at 2nd MCP x1  Destruction method: cryotherapy   Informed consent: discussed and consent obtained   Lesion destroyed using liquid nitrogen: Yes   Region frozen until ice ball extended beyond lesion: Yes   Outcome: patient tolerated procedure well with no complications   Post-procedure details: wound care instructions given   Additional details:  Prior to procedure, discussed risks of blister formation, small wound, skin dyspigmentation, or rare scar following cryotherapy. Recommend Vaseline ointment to treated areas while healing.   Inflamed seborrheic keratosis Right Mid Back at bra line  Symptomatic, irritating, patient would like treated.  Destruction of lesion - Right Mid Back at bra line  Destruction method: cryotherapy   Informed consent: discussed and consent obtained   Lesion destroyed using liquid nitrogen: Yes   Region frozen until ice ball extended beyond lesion: Yes   Outcome: patient tolerated procedure well with no complications   Post-procedure details: wound care instructions given   Additional details:  Prior to procedure, discussed risks of blister formation, small wound, skin dyspigmentation, or rare scar following cryotherapy. Recommend Vaseline ointment to treated areas while healing.   LENTIGINES Exam: scattered tan macules Due to sun exposure Treatment Plan: Benign-appearing, observe. Recommend daily broad spectrum sunscreen SPF 30+ to sun-exposed areas, reapply every 2 hours as needed.  Call for any changes  Milia - tiny firm white papule at right medial canthus - type of cyst - benign - may be extracted if symptomatic - observe - Recommend using Effaclar at bedtime, wash off in morning.  SEBORRHEIC KERATOSIS - Stuck-on, waxy, tan-brown papules and/or plaques  - Benign-appearing - Discussed benign etiology and prognosis. - Observe - Call for any changes    Return for TBSE As Scheduled.  I, Lawson Radar, CMA, am  acting as scribe for Willeen Niece, MD.   Documentation: I have reviewed the above documentation for accuracy and completeness, and I agree with the above.  Willeen Niece, MD

## 2022-10-14 ENCOUNTER — Telehealth: Payer: Self-pay

## 2022-10-14 NOTE — Telephone Encounter (Signed)
Patient sent a message through the MyChart appt request stating: Dr. Roseanne Reno removed (cryotherapy) a rough spot on knuckle of right hand during June 19 visit. She instructed me to call the office if any of it remained after healing. A small, rough spot remains there now. Should we schedule another visit to address this? I will be unavailable August 20 through September 2.hThank you!

## 2022-10-15 ENCOUNTER — Encounter: Payer: Self-pay | Admitting: Dermatology

## 2022-10-15 ENCOUNTER — Ambulatory Visit: Payer: Medicare Other | Admitting: Dermatology

## 2022-10-15 VITALS — BP 140/79 | HR 64

## 2022-10-15 DIAGNOSIS — L821 Other seborrheic keratosis: Secondary | ICD-10-CM

## 2022-10-15 DIAGNOSIS — L814 Other melanin hyperpigmentation: Secondary | ICD-10-CM

## 2022-10-15 DIAGNOSIS — L570 Actinic keratosis: Secondary | ICD-10-CM

## 2022-10-15 DIAGNOSIS — W908XXA Exposure to other nonionizing radiation, initial encounter: Secondary | ICD-10-CM | POA: Diagnosis not present

## 2022-10-15 DIAGNOSIS — L578 Other skin changes due to chronic exposure to nonionizing radiation: Secondary | ICD-10-CM

## 2022-10-15 NOTE — Progress Notes (Signed)
Follow-Up Visit   Subjective  Teresa Huerta is a 67 y.o. female who presents for the following: Scaly spot at right hand still there after freezing and pink scaly spots at left arm   The patient has spots, moles and lesions to be evaluated, some may be new or changing and the patient may have concern these could be cancer.   The following portions of the chart were reviewed this encounter and updated as appropriate: medications, allergies, medical history  Review of Systems:  No other skin or systemic complaints except as noted in HPI or Assessment and Plan.  Objective  Well appearing patient in no apparent distress; mood and affect are within normal limits.  A focused examination was performed of the following areas: Right hand, left arm  Relevant exam findings are noted in the Assessment and Plan.  Right Dorsal Hand  2nd mcp x 1 , left hand dorsum x 3, left forearm x 6 (10) Erythematous thin papules/macules with gritty scale.     Assessment & Plan   SEBORRHEIC KERATOSIS - Stuck-on, waxy, tan-brown papules and/or plaques  - Benign-appearing - Discussed benign etiology and prognosis. - Observe - Call for any changes At left dorsum hand   LENTIGINES Exam: scattered tan macules Due to sun exposure Treatment Plan: Benign-appearing, observe. Recommend daily broad spectrum sunscreen SPF 30+ to sun-exposed areas, reapply every 2 hours as needed.  Call for any changes    Actinic keratosis (10) Right Dorsal Hand  2nd mcp x 1 , left hand dorsum x 3, left forearm x 6  Actinic keratoses are precancerous spots that appear secondary to cumulative UV radiation exposure/sun exposure over time. They are chronic with expected duration over 1 year. A portion of actinic keratoses will progress to squamous cell carcinoma of the skin. It is not possible to reliably predict which spots will progress to skin cancer and so treatment is recommended to prevent development of skin  cancer.  Recommend daily broad spectrum sunscreen SPF 30+ to sun-exposed areas, reapply every 2 hours as needed.  Recommend staying in the shade or wearing long sleeves, sun glasses (UVA+UVB protection) and wide brim hats (4-inch brim around the entire circumference of the hat). Call for new or changing lesions.  Destruction of lesion - Right Dorsal Hand  2nd mcp x 1 , left hand dorsum x 3, left forearm x 6 (10)  Destruction method: cryotherapy   Informed consent: discussed and consent obtained   Lesion destroyed using liquid nitrogen: Yes   Region frozen until ice ball extended beyond lesion: Yes   Outcome: patient tolerated procedure well with no complications   Post-procedure details: wound care instructions given   Additional details:  Prior to procedure, discussed risks of blister formation, small wound, skin dyspigmentation, or rare scar following cryotherapy. Recommend Vaseline ointment to treated areas while healing.   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  - Recommend staying in the shade or wearing long sleeves, sun glasses (UVA+UVB protection) and wide brim hats (4-inch brim around the entire circumference of the hat). - Call for new or changing lesions.   Return for keep follow up as scheduled in march .  I, Asher Muir, CMA, am acting as scribe for Willeen Niece, MD.   Documentation: I have reviewed the above  documentation for accuracy and completeness, and I agree with the above.  Willeen Niece, MD

## 2022-10-15 NOTE — Patient Instructions (Addendum)
Recommend spf with zinc like elta md uv clear    Seborrheic Keratosis  What causes seborrheic keratoses? Seborrheic keratoses are harmless, common skin growths that first appear during adult life.  As time goes by, more growths appear.  Some people may develop a large number of them.  Seborrheic keratoses appear on both covered and uncovered body parts.  They are not caused by sunlight.  The tendency to develop seborrheic keratoses can be inherited.  They vary in color from skin-colored to gray, brown, or even black.  They can be either smooth or have a rough, warty surface.   Seborrheic keratoses are superficial and look as if they were stuck on the skin.  Under the microscope this type of keratosis looks like layers upon layers of skin.  That is why at times the top layer may seem to fall off, but the rest of the growth remains and re-grows.    Treatment Seborrheic keratoses do not need to be treated, but can easily be removed in the office.  Seborrheic keratoses often cause symptoms when they rub on clothing or jewelry.  Lesions can be in the way of shaving.  If they become inflamed, they can cause itching, soreness, or burning.  Removal of a seborrheic keratosis can be accomplished by freezing, burning, or surgery. If any spot bleeds, scabs, or grows rapidly, please return to have it checked, as these can be an indication of a skin cancer.    Cryotherapy Aftercare  Wash gently with soap and water everyday.   Apply Vaseline and Band-Aid daily until healed.   Actinic keratoses are precancerous spots that appear secondary to cumulative UV radiation exposure/sun exposure over time. They are chronic with expected duration over 1 year. A portion of actinic keratoses will progress to squamous cell carcinoma of the skin. It is not possible to reliably predict which spots will progress to skin cancer and so treatment is recommended to prevent development of skin cancer.  Recommend daily broad  spectrum sunscreen SPF 30+ to sun-exposed areas, reapply every 2 hours as needed.  Recommend staying in the shade or wearing long sleeves, sun glasses (UVA+UVB protection) and wide brim hats (4-inch brim around the entire circumference of the hat). Call for new or changing lesions.    Due to recent changes in healthcare laws, you may see results of your pathology and/or laboratory studies on MyChart before the doctors have had a chance to review them. We understand that in some cases there may be results that are confusing or concerning to you. Please understand that not all results are received at the same time and often the doctors may need to interpret multiple results in order to provide you with the best plan of care or course of treatment. Therefore, we ask that you please give [place] 2 business days to thoroughly review all your results before contacting the office for clarification. Should we see a critical lab result, you will be contacted sooner.   If You Need Anything After Your Visit  If you have any questions or concerns for your doctor, please call our main line at [phone] and press option 4 to reach your doctor's medical assistant. If no one answers, please leave a voicemail as directed and we will return your call as soon as possible. Messages left after 4 pm will be answered the following business day.   You may also send [place] a message via MyChart. We typically respond to MyChart messages within 1-2 business days.  For prescription refills, please ask your pharmacy to contact our office. Our fax number is [phone].  If you have an urgent issue when the clinic is closed that cannot wait until the next business day, you can page your doctor at the number below.    Please note that while we do our best to be available for urgent issues outside of office hours, we are not available 24/7.   If you have an urgent issue and are unable to reach [place], you may choose to seek medical care  at your doctor's office, retail clinic, urgent care center, or emergency room.  If you have a medical emergency, please immediately call 911 or go to the emergency department.  Pager Numbers  - Dr. Gwen Pounds: [phone]  - Dr. Roseanne Reno: [phone]  In the event of inclement weather, please call our main line at [phone] for an update on the status of any delays or closures.  Dermatology Medication Tips: Please keep the boxes that topical medications come in in order to help keep track of the instructions about where and how to use these. Pharmacies typically print the medication instructions only on the boxes and not directly on the medication tubes.   If your medication is too expensive, please contact our office at [phone] option 4 or send [place] a message through MyChart.   We are unable to tell what your co-pay for medications will be in advance as this is different depending on your insurance coverage. However, we may be able to find a substitute medication at lower cost or fill out paperwork to get insurance to cover a needed medication.   If a prior authorization is required to get your medication covered by your insurance company, please allow [place] 1-2 business days to complete this process.  Drug prices often vary depending on where the prescription is filled and some pharmacies may offer cheaper prices.  The website www.goodrx.com contains coupons for medications through different pharmacies. The prices here do not account for what the cost may be with help from insurance (it may be cheaper with your insurance), but the website can give you the price if you did not use any insurance.  - You can print the associated coupon and take it with your prescription to the pharmacy.  - You may also stop by our office during regular business hours and pick up a GoodRx coupon card.  - If you need your prescription sent electronically to a different pharmacy, notify our office through  [facility] or by phone at [phone] option 4.     Si Usted Necesita Algo Despus de Su Visita  Tambin puede enviarnos un mensaje a travs de Clinical cytogeneticist. Por lo general respondemos a los mensajes de MyChart en el transcurso de 1 a 2 das hbiles.  Para renovar recetas, por favor pida a su farmacia que se ponga en contacto con nuestra oficina. Annie Sable de fax es [place] [phone].  Si tiene un asunto urgente cuando la clnica est cerrada y que no puede esperar hasta el siguiente da hbil, puede llamar/localizar a su doctor(a) al nmero que aparece a continuacin.   Por favor, tenga en cuenta que aunque hacemos todo lo posible para estar disponibles para asuntos urgentes fuera del horario de [place], no estamos disponibles las 24 horas del da, los 7 [address]  [address] de la [place].   Si tiene un problema urgente y no puede comunicarse con nosotros, puede optar por buscar atencin mdica  en el consultorio de su doctor(a),  en una clnica privada, en un centro de atencin urgente o en una sala de emergencias.  Si tiene Engineer, drilling, por favor llame inmediatamente al 911 o vaya a la sala de emergencias.  Nmeros de bper  - Dr. Gwen Pounds: [phone]  - Dra. Roseanne Reno: [phone]  En caso de inclemencias del [place], por favor llame a Lacy Duverney principal al [phone] para una actualizacin sobre el [place] de cualquier retraso o cierre.  Consejos para la medicacin en dermatologa: Por favor, guarde las cajas en las que vienen los medicamentos de uso tpico para ayudarle a seguir las instrucciones sobre dnde y cmo usarlos. Las farmacias generalmente imprimen las instrucciones del medicamento slo en las cajas y no directamente en los tubos del [place].   Si su medicamento es muy caro, por favor, pngase en contacto con Rolm Gala llamando al [phone] y presione la opcin 4 o envenos un mensaje a travs de Clinical cytogeneticist.   No podemos decirle cul ser su copago por  los medicamentos por adelantado ya que esto es diferente dependiendo de la cobertura de su seguro. Sin embargo, es posible que podamos encontrar un medicamento sustituto a Audiological scientist un formulario para que el seguro cubra el medicamento que se considera necesario.   Si se requiere una autorizacin previa para que su compaa de seguros [place] su medicamento, por favor permtanos de 1 a 2 das hbiles para completar [address]  Los precios de los medicamentos varan con frecuencia dependiendo del Environmental consultant de dnde se surte la receta y alguna farmacias pueden ofrecer precios ms baratos.  El sitio web www.goodrx.com tiene cupones para medicamentos de Health and safety inspector. Los precios aqu no tienen en cuenta lo que podra costar con la ayuda del seguro (puede ser ms barato con su seguro), pero el sitio web puede darle el precio si no utiliz Tourist information centre manager.  - Puede imprimir el cupn correspondiente y llevarlo con su receta a la farmacia.  - Tambin puede pasar por nuestra oficina durante el horario de atencin regular y Education officer, museum una tarjeta de cupones de GoodRx.  - Si necesita que su receta se enve electrnicamente a una farmacia diferente, informe a nuestra oficina a travs de MyChart de [place] o por telfono llamando al [phone] y presione la opcin 4.

## 2022-11-15 IMAGING — MG DIGITAL SCREENING BILAT W/ TOMO W/ CAD
6 of 10 series · 6 of 30 positions shown · non-contrast
Comparison: Previous exam(s).

CLINICAL DATA: Screening.

EXAM:
DIGITAL SCREENING BILATERAL MAMMOGRAM WITH TOMO AND CAD

[R MLO synth-2D]
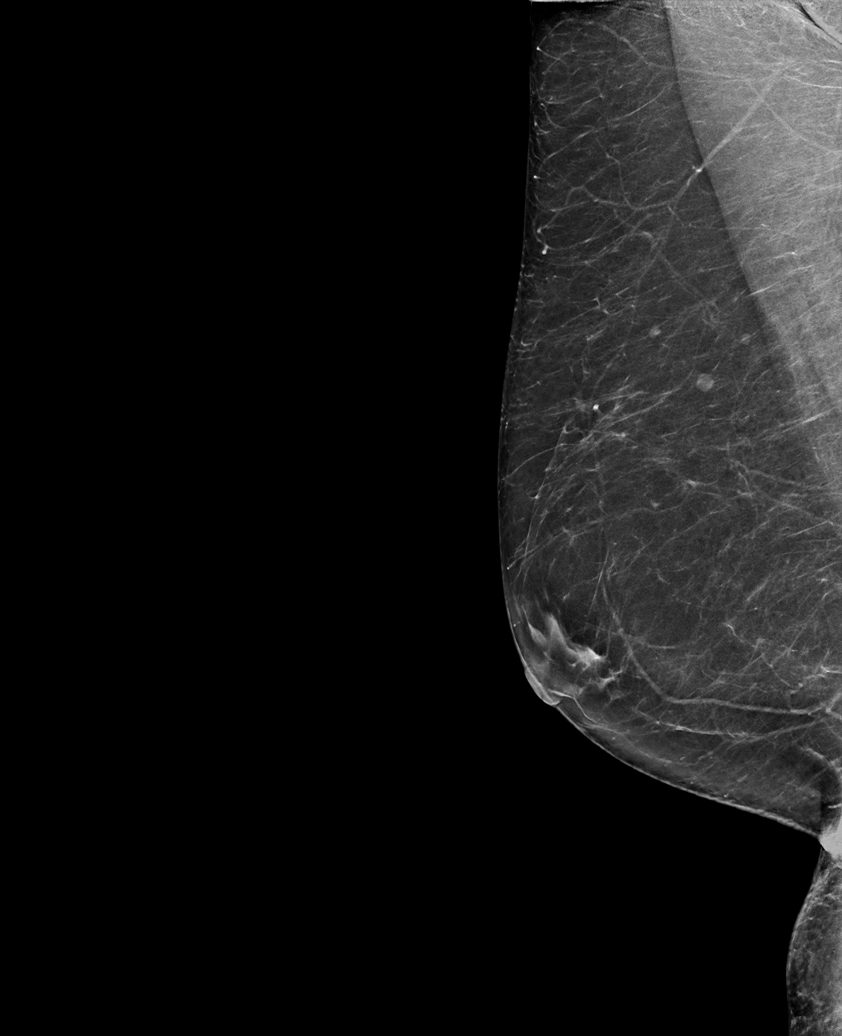

[L CC synth-2D (1 of 2)]
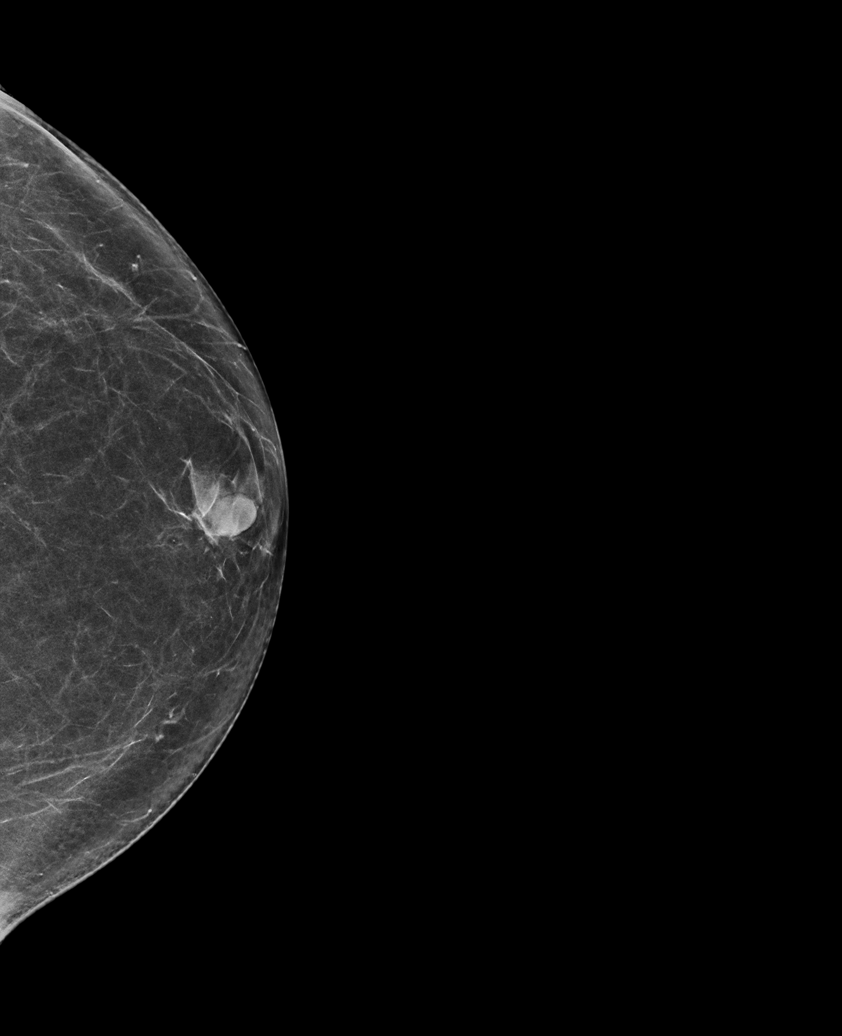

[L CC synth-2D (2 of 2)]
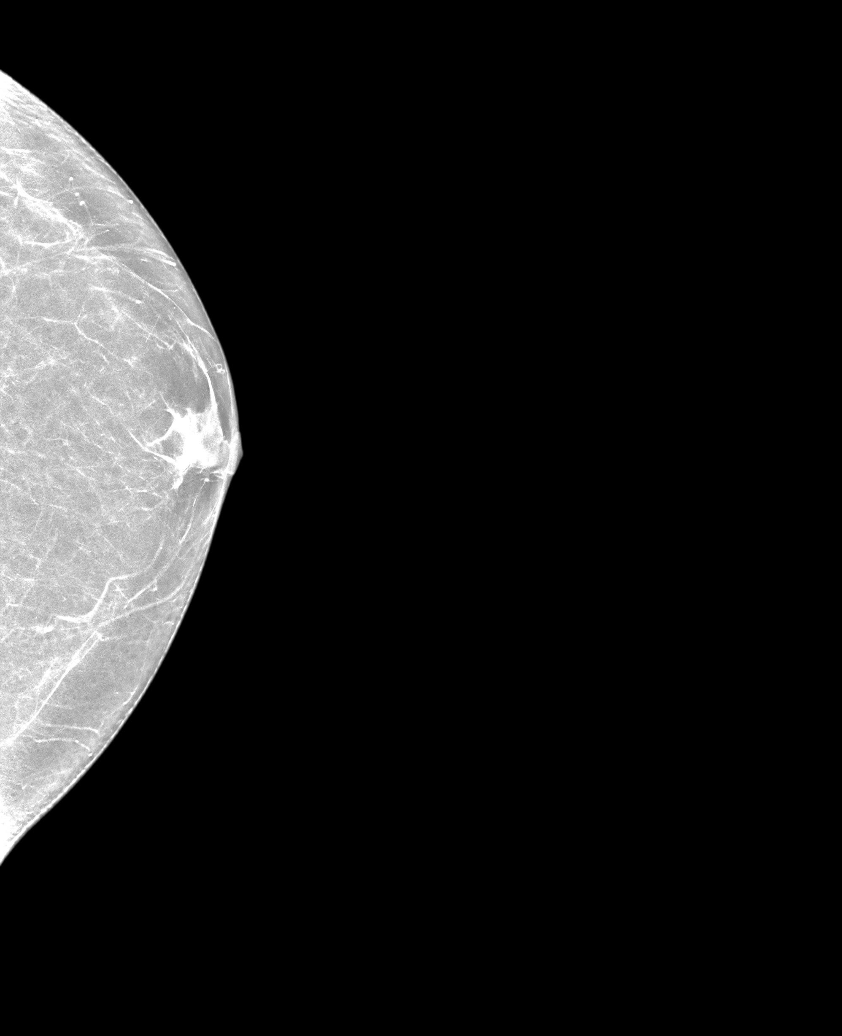

[L MLO synth-2D]
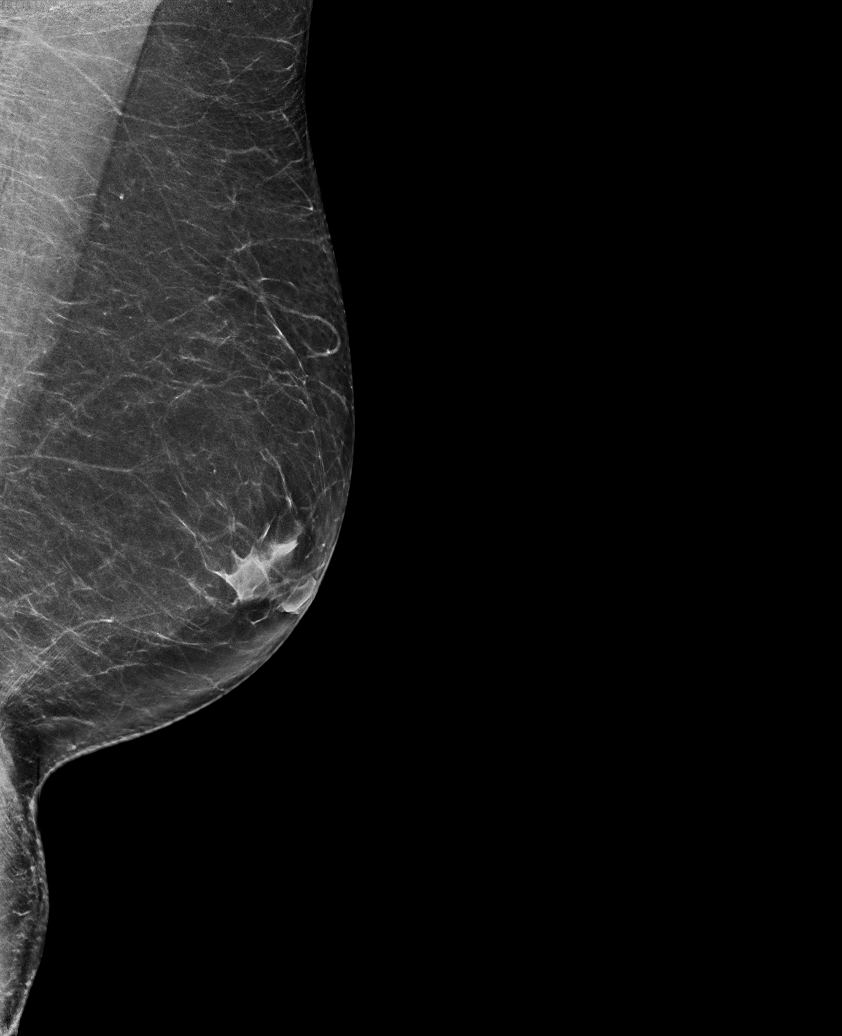

[R CC synth-2D]
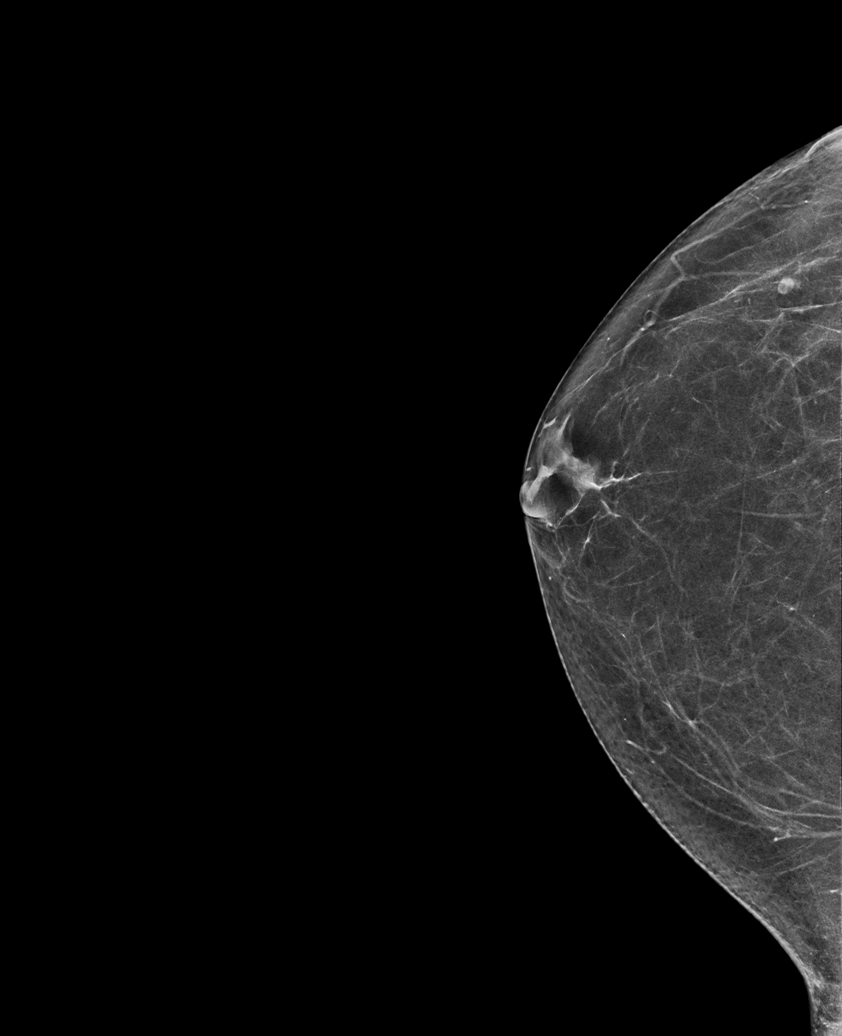

[L CC tomo · tomo slice 33/64.0]
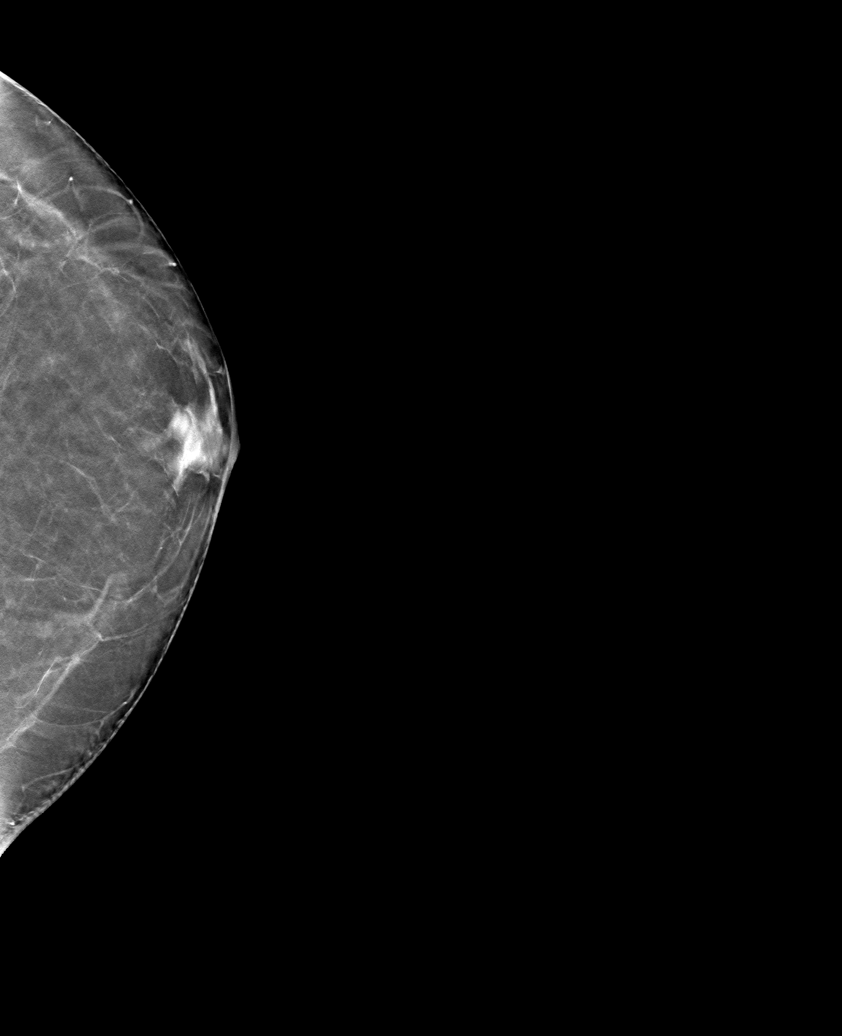

[6 of 30 positions shown; findings below may reference images not displayed]

ACR Breast Density Category b: There are scattered areas of
fibroglandular density.
FINDINGS: There are no findings suspicious for malignancy. Images were
processed with CAD.
IMPRESSION: No mammographic evidence of malignancy. A result letter of this
screening mammogram will be mailed directly to the patient.

RECOMMENDATION:
Screening mammogram in one year. (Code:[id number])

BI-RADS CATEGORY  1: Negative.

## 2023-02-04 ENCOUNTER — Other Ambulatory Visit: Payer: Self-pay | Admitting: Internal Medicine

## 2023-02-04 DIAGNOSIS — Z1231 Encounter for screening mammogram for malignant neoplasm of breast: Secondary | ICD-10-CM

## 2023-04-07 ENCOUNTER — Ambulatory Visit
Admission: RE | Admit: 2023-04-07 | Discharge: 2023-04-07 | Disposition: A | Discharge status: Home / Self Care | Payer: Medicare Other | Source: Ambulatory Visit | Attending: Internal Medicine | Admitting: Internal Medicine

## 2023-04-07 DIAGNOSIS — Z1231 Encounter for screening mammogram for malignant neoplasm of breast: Secondary | ICD-10-CM | POA: Insufficient documentation

## 2023-05-20 ENCOUNTER — Ambulatory Visit (INDEPENDENT_AMBULATORY_CARE_PROVIDER_SITE_OTHER): Payer: Medicare Other | Admitting: Dermatology

## 2023-05-20 DIAGNOSIS — D2262 Melanocytic nevi of left upper limb, including shoulder: Secondary | ICD-10-CM

## 2023-05-20 DIAGNOSIS — L570 Actinic keratosis: Secondary | ICD-10-CM | POA: Diagnosis not present

## 2023-05-20 DIAGNOSIS — L578 Other skin changes due to chronic exposure to nonionizing radiation: Secondary | ICD-10-CM | POA: Diagnosis not present

## 2023-05-20 DIAGNOSIS — L814 Other melanin hyperpigmentation: Secondary | ICD-10-CM

## 2023-05-20 DIAGNOSIS — D239 Other benign neoplasm of skin, unspecified: Secondary | ICD-10-CM

## 2023-05-20 DIAGNOSIS — D1801 Hemangioma of skin and subcutaneous tissue: Secondary | ICD-10-CM

## 2023-05-20 DIAGNOSIS — D492 Neoplasm of unspecified behavior of bone, soft tissue, and skin: Secondary | ICD-10-CM

## 2023-05-20 DIAGNOSIS — D2261 Melanocytic nevi of right upper limb, including shoulder: Secondary | ICD-10-CM

## 2023-05-20 DIAGNOSIS — D225 Melanocytic nevi of trunk: Secondary | ICD-10-CM

## 2023-05-20 DIAGNOSIS — D229 Melanocytic nevi, unspecified: Secondary | ICD-10-CM

## 2023-05-20 DIAGNOSIS — W908XXA Exposure to other nonionizing radiation, initial encounter: Secondary | ICD-10-CM | POA: Diagnosis not present

## 2023-05-20 DIAGNOSIS — D485 Neoplasm of uncertain behavior of skin: Secondary | ICD-10-CM

## 2023-05-20 DIAGNOSIS — Z1283 Encounter for screening for malignant neoplasm of skin: Secondary | ICD-10-CM | POA: Diagnosis not present

## 2023-05-20 DIAGNOSIS — L905 Scar conditions and fibrosis of skin: Secondary | ICD-10-CM

## 2023-05-20 DIAGNOSIS — D2271 Melanocytic nevi of right lower limb, including hip: Secondary | ICD-10-CM

## 2023-05-20 DIAGNOSIS — L821 Other seborrheic keratosis: Secondary | ICD-10-CM

## 2023-05-20 HISTORY — DX: Other benign neoplasm of skin, unspecified: D23.9

## 2023-05-20 NOTE — Patient Instructions (Addendum)

## 2023-05-20 NOTE — Progress Notes (Unsigned)
 Follow-Up Visit   Subjective  Teresa Huerta is a 68 y.o. female who presents for the following: Skin Cancer Screening and Full Body Skin Exam  The patient presents for Total-Body Skin Exam (TBSE) for skin cancer screening and mole check. The patient has spots, moles and lesions to be evaluated, some may be new or changing and the patient may have concern these could be cancer.  Patient has an inflamed papule on the R hand x 2 weeks previously frozen in past, but cleared up until recently and irritated skin lesion on the L forearm that she would like checked today.  The following portions of the chart were reviewed this encounter and updated as appropriate: medications, allergies, medical history  Review of Systems:  No other skin or systemic complaints except as noted in HPI or Assessment and Plan.  Objective  Well appearing patient in no apparent distress; mood and affect are within normal limits.  A full examination was performed including scalp, head, eyes, ears, nose, lips, neck, chest, axillae, abdomen, back, buttocks, bilateral upper extremities, bilateral lower extremities, hands, feet, fingers, toes, fingernails, and toenails. All findings within normal limits unless otherwise noted below.   Relevant physical exam findings are noted in the Assessment and Plan.  R hand dorsum 2nd MCP x 1, L arm x 2, L hand x 3 Waxy pink smooth papule R hand dorsum at 2nd MCP. Pink scaly macules L arm/hand  Right Lower Back 0.5 x 0.2 cm brown black macule.   Left forearm     R upper arm           Assessment & Plan   SKIN CANCER SCREENING PERFORMED TODAY.  ACTINIC DAMAGE - Chronic condition, secondary to cumulative UV/sun exposure - diffuse scaly erythematous macules with underlying dyspigmentation - Recommend daily broad spectrum sunscreen SPF 30+ to sun-exposed areas, reapply every 2 hours as needed.  - Staying in the shade or wearing long sleeves, sun glasses (UVA+UVB  protection) and wide brim hats (4-inch brim around the entire circumference of the hat) are also recommended for sun protection.  - Call for new or changing lesions.  LENTIGINES, SEBORRHEIC KERATOSES, HEMANGIOMAS - Benign normal skin lesions - Benign-appearing - Call for any changes  MELANOCYTIC NEVI - R upper arm 2.80mm dark brown macules x 4 - L forearm 1.5 mm dark brown macule - R foot dorsum x 2- 2.0 dark brown macules - Tan-brown and/or pink-flesh-colored symmetric macules and papules - Benign appearing on exam today, Stable - Observation - Call clinic for new or changing moles - Recommend daily use of broad spectrum spf 30+ sunscreen to sun-exposed areas.  ACTINIC KERATOSIS R hand dorsum 2nd MCP x 1, L arm x 2, L hand x 3 2nd treatment on the R hand dorsum 2nd MCP (vs ISK)- recheck on f/up, RTC if doesn't clear.  Actinic keratoses are precancerous spots that appear secondary to cumulative UV radiation exposure/sun exposure over time. They are chronic with expected duration over 1 year. A portion of actinic keratoses will progress to squamous cell carcinoma of the skin. It is not possible to reliably predict which spots will progress to skin cancer and so treatment is recommended to prevent development of skin cancer.  Recommend daily broad spectrum sunscreen SPF 30+ to sun-exposed areas, reapply every 2 hours as needed.  Recommend staying in the shade or wearing long sleeves, sun glasses (UVA+UVB protection) and wide brim hats (4-inch brim around the entire circumference of the hat). Call for  new or changing lesions.  Destruction of lesion - R hand dorsum 2nd MCP x 1, L arm x 2, L hand x 3  Destruction method: cryotherapy   Informed consent: discussed and consent obtained   Lesion destroyed using liquid nitrogen: Yes   Region frozen until ice ball extended beyond lesion: Yes   Outcome: patient tolerated procedure well with no complications   Post-procedure details: wound care  instructions given   Additional details:  Prior to procedure, discussed risks of blister formation, small wound, skin dyspigmentation, or rare scar following cryotherapy. Recommend Vaseline ointment to treated areas while healing.  NEOPLASM OF UNCERTAIN BEHAVIOR OF SKIN Right Lower Back Epidermal / dermal shaving  Lesion diameter (cm):  0.7 Informed consent: discussed and consent obtained   Patient was prepped and draped in usual sterile fashion: area prepped with alcohol. Anesthesia: the lesion was anesthetized in a standard fashion   Anesthetic:  1% lidocaine w/ epinephrine 1-100,000 buffered w/ 8.4% NaHCO3 Instrument used: flexible razor blade   Hemostasis achieved with: pressure, aluminum chloride and electrodesiccation   Outcome: patient tolerated procedure well   Post-procedure details: wound care instructions given   Post-procedure details comment:  Ointment and small bandage applied Specimen 1 - Surgical pathology Differential Diagnosis: D48.5 r/o dysplastic nevus Check Margins: yes  Scar Exam: R top of shoulder - pink white smooth scar erythema at edge, reviewed photo, no changes   Benign-appearing.  Patient states benign growth excised in past 2013 by Dr. Purcell Nails.  Path not available since >32 years old.  Observation.  Return in about 1 year (around 05/19/2024) for TBSE.  Maylene Roes, CMA, am acting as scribe for Willeen Niece, MD .  Documentation: I have reviewed the above documentation for accuracy and completeness, and I agree with the above.  Willeen Niece, MD

## 2023-05-22 LAB — SURGICAL PATHOLOGY

## 2023-05-26 ENCOUNTER — Telehealth: Payer: Self-pay

## 2023-05-26 NOTE — Telephone Encounter (Signed)
 Patient advised of BX results. aw

## 2023-05-26 NOTE — Telephone Encounter (Signed)
-----   Message from Willeen Niece sent at 05/26/2023  1:00 PM EDT ----- 1. Skin, right lower back :       DYSPLASTIC COMPOUND NEVUS WITH MODERATE ATYPIA, CLOSE TO MARGIN   Moderately atypical mole, recommend observation - please call patient

## 2023-05-26 NOTE — Telephone Encounter (Signed)
 Left pt msg to call for bx results/sh

## 2023-06-02 ENCOUNTER — Other Ambulatory Visit: Payer: Self-pay | Admitting: Physician Assistant

## 2023-06-02 ENCOUNTER — Ambulatory Visit
Admission: RE | Admit: 2023-06-02 | Discharge: 2023-06-02 | Disposition: A | Discharge status: Home / Self Care | Source: Ambulatory Visit | Attending: Physician Assistant | Admitting: Physician Assistant

## 2023-06-02 DIAGNOSIS — R1030 Lower abdominal pain, unspecified: Secondary | ICD-10-CM

## 2023-06-02 DIAGNOSIS — R319 Hematuria, unspecified: Secondary | ICD-10-CM | POA: Diagnosis present

## 2023-06-02 MED ORDER — IOHEXOL 300 MG/ML  SOLN
100.0000 mL | Freq: Once | INTRAMUSCULAR | Status: AC | PRN
  Administered 2023-06-02: 100 mL via INTRAVENOUS

## 2023-09-21 ENCOUNTER — Encounter: Payer: Self-pay | Admitting: Dermatology

## 2023-09-22 MED ORDER — CLOBETASOL PROPIONATE 0.05 % EX CREA: TOPICAL_CREAM | CUTANEOUS | 1 refills | Status: AC

## 2023-10-07 ENCOUNTER — Ambulatory Visit: Admitting: Dermatology

## 2023-10-07 DIAGNOSIS — Z79899 Other long term (current) drug therapy: Secondary | ICD-10-CM | POA: Diagnosis not present

## 2023-10-07 DIAGNOSIS — L300 Nummular dermatitis: Secondary | ICD-10-CM

## 2023-10-07 DIAGNOSIS — Z7189 Other specified counseling: Secondary | ICD-10-CM

## 2023-10-07 DIAGNOSIS — L820 Inflamed seborrheic keratosis: Secondary | ICD-10-CM

## 2023-10-07 DIAGNOSIS — L821 Other seborrheic keratosis: Secondary | ICD-10-CM

## 2023-10-07 DIAGNOSIS — L2089 Other atopic dermatitis: Secondary | ICD-10-CM

## 2023-10-07 NOTE — Progress Notes (Unsigned)
 Follow-Up Visit   Subjective  Teresa Huerta is a 68 y.o. female who presents for the following: Rash on arms, back, starting at legs, feet, hands starting spreading 7/19. Patient states on 7/14 started at L lower ankle sent mychart message and Dr. Jackquline called in clobetasol . Patient reports the clobetasol  helped a lot. Denies any new medications or new products.   Patient reports similar rash in May and used calamine.   Reports tick bite on June 22nd denies any headaches or fevers.   The following portions of the chart were reviewed this encounter and updated as appropriate: medications, allergies, medical history  Review of Systems:  No other skin or systemic complaints except as noted in HPI or Assessment and Plan.  Objective  Well appearing patient in no apparent distress; mood and affect are within normal limits.  A focused examination was performed of the following areas: Legs, back, feet, arms  Relevant exam findings are noted in the Assessment and Plan.                     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 It is often associated with allergic rhinitis and/or asthma and can require treatment with topical medications, phototherapy, or in severe cases biologic injectable medication (Dupixent; Adbry) or Oral JAK inhibitors.  Exam: Appears to be most likely eczema vs stasis dermatitis diagnosis after reviewing history, photos, and on exam today. Photos taken today for chart.   Patient denies any recent/current illness or pains.    Differential diagnosis:  Eczema vs Stasis Dermatitis  Treatment Plan: May use clobetasol  cream BID prn. Avoid applying to face, groin, and axilla. Use as directed. Long-term use can cause thinning of the skin. Patient states not need  refills at this time.   Topical steroids (such as triamcinolone, fluocinolone, fluocinonide, mometasone, clobetasol , halobetasol, betamethasone, hydrocortisone) can cause thinning and lightening of the skin if they are used for too long in the same area. Your physician has selected the right strength medicine for your problem and area affected on the body. Please use your medication only as directed by your physician to prevent side effects.     SEBORRHEIC KERATOSIS, Inflamed Many existing Sks suddenly became inflamed recently. Pt denies any recent illness or feeling poorly and no new meds etc.  - and is generally healthy and gets regular Physicals and Lab from her PCP. - Several that are inflamed at back, arms, legs - Stuck-on, waxy, tan-brown papules and/or plaques  - Benign-appearing - Discussed benign etiology and prognosis. Discussed is unlikely, but in the DDX is Sign of Leser-Trlat (Eruptive inflamed Sks which can be a sign of internal malignancy) recommend regular follow-ups with PCP, annual physicals, and blood work. Pt declines LN2 today. - Observe - Call for any changes. - Discussed  - Start OTC Voltaren gel to spot treat apply 1-2 times daily, recommend being persistent.  Patient advised to send us  an update on condition via MyChart in 6 weeks.   INFLAMED SEBORRHEIC KERATOSIS   SEBORRHEIC KERATOSIS   OTHER ATOPIC DERMATITIS   COUNSELING AND COORDINATION OF CARE   MEDICATION MANAGEMENT    Return for As scheduled, w/ Dr. Jackquline, TBSE.  I, Jacquelynn V. Wilfred, CMA, am acting as scribe for Alm Rhyme, MD .   Documentation: I have reviewed the  above documentation for accuracy and completeness, and I agree with the above.  Alm Rhyme, MD

## 2023-10-07 NOTE — Patient Instructions (Addendum)
 Start OTC Voltaren gel to spot treat apply 1-2 times daily.    Due to recent changes in healthcare laws, you may see results of your pathology and/or laboratory studies on MyChart before the doctors have had a chance to review them. We understand that in some cases there may be results that are confusing or concerning to you. Please understand that not all results are received at the same time and often the doctors may need to interpret multiple results in order to provide you with the best plan of care or course of treatment. Therefore, we ask that you please give us  2 business days to thoroughly review all your results before contacting the office for clarification. Should we see a critical lab result, you will be contacted sooner.   If You Need Anything After Your Visit  If you have any questions or concerns for your doctor, please call our main line at [phone] and press option 4 to reach your doctor's medical assistant. If no one answers, please leave a voicemail as directed and we will return your call as soon as possible. Messages left after 4 pm will be answered the following business day.   You may also send us  a message via MyChart. We typically respond to MyChart messages within 1-2 business days.  For prescription refills, please ask your pharmacy to contact our office. Our fax number is [phone].  If you have an urgent issue when the clinic is closed that cannot wait until the next business day, you can page your doctor at the number below.    Please note that while we do our best to be available for urgent issues outside of office hours, we are not available 24/7.   If you have an urgent issue and are unable to reach us , you may choose to seek medical care at your doctor's office, retail clinic, urgent care center, or emergency room.  If you have a medical emergency, please immediately call 911 or go to the emergency department.  Pager Numbers  - Dr. Hester:  [phone]  - Dr. Jackquline: [phone]  - Dr. Claudene: [phone]   In the event of inclement weather, please call our main line at [phone] for an update on the status of any delays or closures.  Dermatology Medication Tips: Please keep the boxes that topical medications come in in order to help keep track of the instructions about where and how to use these. Pharmacies typically print the medication instructions only on the boxes and not directly on the medication tubes.   If your medication is too expensive, please contact our office at [phone] option 4 or send us  a message through MyChart.   We are unable to tell what your co-pay for medications will be in advance as this is different depending on your insurance coverage. However, we may be able to find a substitute medication at lower cost or fill out paperwork to get insurance to cover a needed medication.   If a prior authorization is required to get your medication covered by your insurance company, please allow us  1-2 business days to complete this process.  Drug prices often vary depending on where the prescription is filled and some pharmacies may offer cheaper prices.  The website www.goodrx.com contains coupons for medications through different pharmacies. The prices here do not account for what the cost may be with help from insurance (it may be cheaper with your insurance), but the website can give you the price if you did not  use any insurance.  - You can print the associated coupon and take it with your prescription to the pharmacy.  - You may also stop by our office during regular business hours and pick up a GoodRx coupon card.  - If you need your prescription sent electronically to a different pharmacy, notify our office through [facility] or by phone at [phone] option 4.     Si Usted Necesita Algo Despus de Su Visita  Tambin puede enviarnos un mensaje a travs de Clinical cytogeneticist. Por lo general  respondemos a los mensajes de MyChart en el transcurso de 1 a 2 das hbiles.  Para renovar recetas, por favor pida a su farmacia que se ponga en contacto con nuestra oficina. Randi lakes de fax es [place] [phone].  Si tiene un asunto urgente cuando la clnica est cerrada y que no puede esperar hasta el siguiente da hbil, puede llamar/localizar a su doctor(a) al nmero que aparece a continuacin.   Por favor, tenga en cuenta que aunque hacemos todo lo posible para estar disponibles para asuntos urgentes fuera del horario de [place], no estamos disponibles las 24 horas del da, los 7 [address]  [address] de la [place].   Si tiene un problema urgente y no puede comunicarse con nosotros, puede optar por buscar atencin mdica  en el consultorio de su doctor(a), en una clnica privada, en un centro de atencin urgente o en una sala de emergencias.  Si tiene Engineer, drilling, por favor llame inmediatamente al 911 o vaya a la sala de emergencias.  Nmeros de bper  - Dr. Hester: [phone]  - Dra. Jackquline: [phone]  - Dr. Claudene: [phone]   En caso de inclemencias del tiempo, por favor llame a landry capes principal al [phone] para una actualizacin sobre el [place] de cualquier retraso o cierre.  Consejos para la medicacin en dermatologa: Por favor, guarde las cajas en las que vienen los medicamentos de uso tpico para ayudarle a seguir las instrucciones sobre dnde y cmo usarlos. Las farmacias generalmente imprimen las instrucciones del medicamento slo en las cajas y no directamente en los tubos del [place].   Si su medicamento es muy caro, por favor, pngase en contacto con landry rieger llamando al [phone] y presione la opcin 4 o envenos un mensaje a travs de Clinical cytogeneticist.   No podemos decirle cul ser su copago por los medicamentos por adelantado ya que esto es diferente dependiendo de la cobertura de su seguro. Sin embargo, es posible que podamos encontrar un  medicamento sustituto a Audiological scientist un formulario para que el seguro cubra el medicamento que se considera necesario.   Si se requiere una autorizacin previa para que su compaa de seguros malta su medicamento, por favor permtanos de 1 a 2 das hbiles para completar este proceso.  Los precios de los medicamentos varan con frecuencia dependiendo del Environmental consultant de dnde se surte la receta y alguna farmacias pueden ofrecer precios ms baratos.  El sitio web www.goodrx.com tiene cupones para medicamentos de Health and safety inspector. Los precios aqu no tienen en cuenta lo que podra costar con la ayuda del seguro (puede ser ms barato con su seguro), pero el sitio web puede darle el precio si no utiliz Tourist information centre manager.  - Puede imprimir el cupn correspondiente y llevarlo con su receta a la farmacia.  - Tambin puede pasar por nuestra oficina durante el horario de atencin regular y Education officer, museum una tarjeta de cupones de GoodRx.  - Si necesita que su receta se enve  electrnicamente a ignacia bender diferente, informe a nuestra oficina a travs de MyChart de [place] o por telfono llamando al [phone] y presione la opcin 4.

## 2023-10-08 ENCOUNTER — Encounter: Payer: Self-pay | Admitting: Dermatology

## 2024-02-10 ENCOUNTER — Other Ambulatory Visit: Payer: Self-pay | Admitting: Internal Medicine

## 2024-02-10 DIAGNOSIS — Z1231 Encounter for screening mammogram for malignant neoplasm of breast: Secondary | ICD-10-CM

## 2024-04-07 ENCOUNTER — Encounter

## 2024-04-23 ENCOUNTER — Encounter

## 2024-06-08 ENCOUNTER — Encounter: Admitting: Dermatology
